# Patient Record
Sex: Female | Born: 1965 | Race: White | Hispanic: No | Smoking: Never smoker
Health system: Southern US, Community
[De-identification: ages and names within clinical notes are randomized; demographics above are authoritative.]

## PROBLEM LIST (undated history)

## (undated) DIAGNOSIS — E119 Type 2 diabetes mellitus without complications: Secondary | ICD-10-CM

## (undated) DIAGNOSIS — E785 Hyperlipidemia, unspecified: Secondary | ICD-10-CM

## (undated) DIAGNOSIS — K219 Gastro-esophageal reflux disease without esophagitis: Secondary | ICD-10-CM

## (undated) DIAGNOSIS — F32A Depression, unspecified: Secondary | ICD-10-CM

## (undated) DIAGNOSIS — G4700 Insomnia, unspecified: Secondary | ICD-10-CM

## (undated) DIAGNOSIS — Z8719 Personal history of other diseases of the digestive system: Secondary | ICD-10-CM

## (undated) DIAGNOSIS — E7800 Pure hypercholesterolemia, unspecified: Secondary | ICD-10-CM

## (undated) DIAGNOSIS — F411 Generalized anxiety disorder: Secondary | ICD-10-CM

## (undated) DIAGNOSIS — F988 Other specified behavioral and emotional disorders with onset usually occurring in childhood and adolescence: Secondary | ICD-10-CM

## (undated) DIAGNOSIS — F329 Major depressive disorder, single episode, unspecified: Secondary | ICD-10-CM

## (undated) DIAGNOSIS — D649 Anemia, unspecified: Secondary | ICD-10-CM

## (undated) DIAGNOSIS — M1990 Unspecified osteoarthritis, unspecified site: Secondary | ICD-10-CM

## (undated) DIAGNOSIS — Z8051 Family history of malignant neoplasm of kidney: Secondary | ICD-10-CM

## (undated) DIAGNOSIS — M889 Osteitis deformans of unspecified bone: Secondary | ICD-10-CM

## (undated) DIAGNOSIS — T7840XA Allergy, unspecified, initial encounter: Secondary | ICD-10-CM

## (undated) DIAGNOSIS — N189 Chronic kidney disease, unspecified: Secondary | ICD-10-CM

## (undated) DIAGNOSIS — I10 Essential (primary) hypertension: Secondary | ICD-10-CM

## (undated) DIAGNOSIS — G2581 Restless legs syndrome: Secondary | ICD-10-CM

## (undated) DIAGNOSIS — C801 Malignant (primary) neoplasm, unspecified: Secondary | ICD-10-CM

## (undated) DIAGNOSIS — C649 Malignant neoplasm of unspecified kidney, except renal pelvis: Secondary | ICD-10-CM

## (undated) DIAGNOSIS — G709 Myoneural disorder, unspecified: Secondary | ICD-10-CM

## (undated) DIAGNOSIS — E079 Disorder of thyroid, unspecified: Secondary | ICD-10-CM

## (undated) DIAGNOSIS — F419 Anxiety disorder, unspecified: Secondary | ICD-10-CM

## (undated) DIAGNOSIS — M810 Age-related osteoporosis without current pathological fracture: Secondary | ICD-10-CM

## (undated) HISTORY — DX: Gastro-esophageal reflux disease without esophagitis: K21.9

## (undated) HISTORY — DX: Type 2 diabetes mellitus without complications: E11.9

## (undated) HISTORY — DX: Chronic kidney disease, unspecified: N18.9

## (undated) HISTORY — DX: Major depressive disorder, single episode, unspecified: F32.9

## (undated) HISTORY — PX: UPPER GASTROINTESTINAL ENDOSCOPY: SHX188

## (undated) HISTORY — DX: Depression, unspecified: F32.A

## (undated) HISTORY — DX: Family history of malignant neoplasm of kidney: Z80.51

## (undated) HISTORY — DX: Allergy, unspecified, initial encounter: T78.40XA

## (undated) HISTORY — DX: Anemia, unspecified: D64.9

## (undated) HISTORY — DX: Personal history of other diseases of the digestive system: Z87.19

## (undated) HISTORY — DX: Anxiety disorder, unspecified: F41.9

## (undated) HISTORY — DX: Essential (primary) hypertension: I10

## (undated) HISTORY — DX: Age-related osteoporosis without current pathological fracture: M81.0

## (undated) HISTORY — DX: Unspecified osteoarthritis, unspecified site: M19.90

## (undated) HISTORY — DX: Myoneural disorder, unspecified: G70.9

## (undated) HISTORY — PX: COLONOSCOPY: SHX174

## (undated) HISTORY — DX: Other specified behavioral and emotional disorders with onset usually occurring in childhood and adolescence: F98.8

## (undated) HISTORY — DX: Generalized anxiety disorder: F41.1

## (undated) HISTORY — DX: Hyperlipidemia, unspecified: E78.5

## (undated) HISTORY — DX: Osteitis deformans of unspecified bone: M88.9

## (undated) HISTORY — DX: Restless legs syndrome: G25.81

## (undated) HISTORY — DX: Malignant neoplasm of unspecified kidney, except renal pelvis: C64.9

## (undated) HISTORY — PX: NASAL SEPTUM SURGERY: SHX37

## (undated) HISTORY — DX: Disorder of thyroid, unspecified: E07.9

## (undated) HISTORY — DX: Insomnia, unspecified: G47.00

## (undated) HISTORY — DX: Pure hypercholesterolemia, unspecified: E78.00

## (undated) HISTORY — DX: Malignant (primary) neoplasm, unspecified: C80.1

---
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
## 1998-01-15 ENCOUNTER — Other Ambulatory Visit: Admission: RE | Admit: 1998-01-15 | Discharge: 1998-01-15 | Payer: Self-pay | Admitting: Family Medicine

## 1999-01-30 ENCOUNTER — Other Ambulatory Visit: Admission: RE | Admit: 1999-01-30 | Discharge: 1999-01-30 | Payer: Self-pay | Admitting: Family Medicine

## 2011-03-14 ENCOUNTER — Encounter: Payer: Self-pay | Admitting: Gastroenterology

## 2012-02-12 ENCOUNTER — Other Ambulatory Visit: Payer: Self-pay | Admitting: Family Medicine

## 2012-07-09 ENCOUNTER — Other Ambulatory Visit: Payer: Self-pay | Admitting: Family Medicine

## 2012-07-09 DIAGNOSIS — Z1231 Encounter for screening mammogram for malignant neoplasm of breast: Secondary | ICD-10-CM

## 2012-07-30 ENCOUNTER — Ambulatory Visit
Admission: RE | Admit: 2012-07-30 | Discharge: 2012-07-30 | Disposition: A | Discharge status: Home / Self Care | Source: Ambulatory Visit | Attending: Family Medicine | Admitting: Family Medicine

## 2012-07-30 DIAGNOSIS — Z1231 Encounter for screening mammogram for malignant neoplasm of breast: Secondary | ICD-10-CM

## 2012-11-30 DIAGNOSIS — G250 Essential tremor: Secondary | ICD-10-CM

## 2012-11-30 HISTORY — DX: Essential tremor: G25.0

## 2013-08-08 ENCOUNTER — Other Ambulatory Visit: Payer: Self-pay

## 2013-08-08 DIAGNOSIS — Z1231 Encounter for screening mammogram for malignant neoplasm of breast: Secondary | ICD-10-CM

## 2013-08-09 ENCOUNTER — Ambulatory Visit
Admission: RE | Admit: 2013-08-09 | Discharge: 2013-08-09 | Disposition: A | Discharge status: Home / Self Care | Source: Ambulatory Visit

## 2013-08-09 DIAGNOSIS — Z1231 Encounter for screening mammogram for malignant neoplasm of breast: Secondary | ICD-10-CM

## 2014-07-19 ENCOUNTER — Other Ambulatory Visit: Payer: Self-pay

## 2014-07-19 DIAGNOSIS — Z1231 Encounter for screening mammogram for malignant neoplasm of breast: Secondary | ICD-10-CM

## 2014-08-11 ENCOUNTER — Ambulatory Visit
Admission: RE | Admit: 2014-08-11 | Discharge: 2014-08-11 | Disposition: A | Discharge status: Home / Self Care | Source: Ambulatory Visit

## 2014-08-11 DIAGNOSIS — Z1231 Encounter for screening mammogram for malignant neoplasm of breast: Secondary | ICD-10-CM

## 2015-08-15 DIAGNOSIS — G44229 Chronic tension-type headache, not intractable: Secondary | ICD-10-CM | POA: Insufficient documentation

## 2015-08-15 HISTORY — DX: Chronic tension-type headache, not intractable: G44.229

## 2015-09-26 HISTORY — PX: ESOPHAGOGASTRODUODENOSCOPY: SHX1529

## 2015-10-08 ENCOUNTER — Other Ambulatory Visit: Payer: Self-pay

## 2015-10-08 DIAGNOSIS — Z1231 Encounter for screening mammogram for malignant neoplasm of breast: Secondary | ICD-10-CM

## 2015-10-26 ENCOUNTER — Ambulatory Visit
Admission: RE | Admit: 2015-10-26 | Discharge: 2015-10-26 | Disposition: A | Discharge status: Home / Self Care | Source: Ambulatory Visit

## 2015-10-26 DIAGNOSIS — Z1231 Encounter for screening mammogram for malignant neoplasm of breast: Secondary | ICD-10-CM

## 2016-01-14 ENCOUNTER — Encounter: Payer: Self-pay | Admitting: Gastroenterology

## 2016-11-18 ENCOUNTER — Other Ambulatory Visit: Payer: Self-pay | Admitting: Family Medicine

## 2016-11-18 DIAGNOSIS — Z1231 Encounter for screening mammogram for malignant neoplasm of breast: Secondary | ICD-10-CM

## 2016-12-04 ENCOUNTER — Ambulatory Visit
Admission: RE | Admit: 2016-12-04 | Discharge: 2016-12-04 | Disposition: A | Discharge status: Home / Self Care | Source: Ambulatory Visit | Attending: Family Medicine | Admitting: Family Medicine

## 2016-12-04 DIAGNOSIS — Z1231 Encounter for screening mammogram for malignant neoplasm of breast: Secondary | ICD-10-CM

## 2017-01-09 ENCOUNTER — Encounter (HOSPITAL_COMMUNITY): Payer: Self-pay

## 2017-01-09 DIAGNOSIS — Z79899 Other long term (current) drug therapy: Secondary | ICD-10-CM | POA: Insufficient documentation

## 2017-01-09 DIAGNOSIS — R6889 Other general symptoms and signs: Secondary | ICD-10-CM | POA: Insufficient documentation

## 2017-01-09 DIAGNOSIS — Z91040 Latex allergy status: Secondary | ICD-10-CM | POA: Insufficient documentation

## 2017-01-09 DIAGNOSIS — Z7982 Long term (current) use of aspirin: Secondary | ICD-10-CM | POA: Diagnosis not present

## 2017-01-09 DIAGNOSIS — Z8781 Personal history of (healed) traumatic fracture: Secondary | ICD-10-CM | POA: Insufficient documentation

## 2017-01-09 DIAGNOSIS — R0789 Other chest pain: Secondary | ICD-10-CM | POA: Diagnosis present

## 2017-01-09 NOTE — ED Triage Notes (Signed)
Pt brought in by EMS with c/o left lower rib pain that began today when pt sister went to assist pt in the bathroom and grab the area while assisting pt, pt has since been having pain throughout the day. Pt denies and SOB.

## 2017-01-10 ENCOUNTER — Emergency Department (HOSPITAL_COMMUNITY)
Admission: EM | Admit: 2017-01-10 | Discharge: 2017-01-10 | Disposition: A | Discharge status: Home / Self Care | Attending: Emergency Medicine | Admitting: Emergency Medicine

## 2017-01-10 ENCOUNTER — Emergency Department (HOSPITAL_COMMUNITY)

## 2017-01-10 DIAGNOSIS — Z789 Other specified health status: Secondary | ICD-10-CM

## 2017-01-10 DIAGNOSIS — Z8781 Personal history of (healed) traumatic fracture: Secondary | ICD-10-CM

## 2017-01-10 MED ORDER — OXYCODONE-ACETAMINOPHEN 5-325 MG PO TABS
1.0000 | ORAL_TABLET | Freq: Once | ORAL | Status: AC
  Administered 2017-01-10: 1 via ORAL
  Filled 2017-01-10: qty 1

## 2017-01-10 NOTE — ED Notes (Signed)
She remains in no distress. Our social worker has spoken with her and is seeking information as to availability of services with her insurance.

## 2017-01-10 NOTE — ED Notes (Signed)
PTAR notified at this time to tx pt. To [place] (she resides with her sister). Pt. Remains in no distress.

## 2017-01-10 NOTE — Progress Notes (Signed)
CSW spoke with patient and patient's daughter at bedside. Patient's daughter reported that patient's family is having issues assisting patient with ADLs. Patient reported that she currently has Home Health PT/OT and that she is being seen by an orthopedic doctor for fractures, patient reported that she is on light bed rest. CSW notified RNCM that patient requires additional assistance in the home, RNCM agreed to meet with patient. CSW provided patient with list of private duty care agencies for additional assistance in the home. CSW signing off, no other needs identified at this time. Please consult if new needs arise.   Abundio Miu, [facility]  Clinical Social Worker [phone]

## 2017-01-10 NOTE — ED Notes (Signed)
In report, the nurse told me that we are awaiting social work consult r/t pt. Inability to complete ADL's; and her current situation (family is caring for her) is insufficient; they "can't take care of her anymore". Pt. Is in no distress.

## 2017-01-10 NOTE — Discharge Instructions (Signed)
Case management and social work have evaluated. We have sent in an order for home health aid evaluation.  Continue taking prescribed medications as indicated

## 2017-01-10 NOTE — ED Notes (Signed)
Per SW, states she is contacting insurance to inquire about services over the weekend

## 2017-01-10 NOTE — ED Notes (Signed)
Our social worker, Ameila has just entered pt's. Room.

## 2017-01-10 NOTE — Care Management Note (Signed)
51 yo F in the ED c/o left lower rib pain. Per SW, pt needs assistance with HH.  Met with pt and daughter. Pt lives with her husband. Daughter is concern about pt's safety and not being able to assist pt with ADL's. Pt reports that her husband assists her. Pt is active with [facility] for PT and OT. Discussed HH aide and a SW and they agreed with these services. Contacted Butch Penny at [place] at [facility] and she accepted the referral. Encouraged daughter to discuss concerns with physician. Daughter Anderson Malta) provided her phone # [phone] in case the [facility] agency is not able to reach her mother. Provided daughter's phone # to Butch Penny.

## 2017-01-10 NOTE — ED Provider Notes (Signed)
Patient was handed off to me by previous EDP Charlann Lange PA-C at shift change pending social work consult. Please see previous note for full history of present illness and physical exam. Briefly, patient is a 51 year old female with left knee fracture and rib fractures from mechanical fall 2 weeks ago. She reports increased difficulty performing ADLs at home, elderly husband unable to care for her. Patient's sister has tried to help but is unfortunately unable to help she works and goes to school daily. Patient reports unsteadiness while walking in her for further injury.   1000: Education officer, museum in room, will wait for recommendations.   1230: SW and CM evaluated patient. They recommend [facility] aide, I have placed the order. Patient and daughter updated about pending order. They are agreeable. Patient will be transported to her sister's home.    Kinnie Feil, PA-C 01/10/17 1228    Rolland Porter, MD 01/16/17 [phone]

## 2017-01-10 NOTE — ED Provider Notes (Signed)
[facility] DEPT Provider Note   CSN: [id number] Arrival date & time: 01/09/17  2231     History   Chief Complaint Chief Complaint  Patient presents with  . Rib Injury    HPI Nancy MAHLER is a 51 y.o. female.  The patient is being treated for left medial epicondylar fracture of the knee as well as right rib fractures, both from falls over the last 2 weeks. She is here for evaluation of new onset of left lateral chest wall pain that started when her sister tried to help her stand. No pain with breathing or SOB. No nausea or vomiting. The patient's sister was attempting to care for her at her home but is concerned she is not equipped to handle her level of care. Both patient and caregiver are requesting assistance with care.    The history is provided by the patient. No language interpreter was used.    History reviewed. No pertinent past medical history.  There are no active problems to display for this patient.   History reviewed. No pertinent surgical history.  OB History    No data available       Home Medications    Prior to Admission medications   Medication Sig Start Date End Date Taking? Authorizing Provider  ARIPiprazole (ABILIFY) 10 MG tablet Take 10 mg by mouth at bedtime.   Yes [provider]  aspirin EC 81 MG tablet Take 81 mg by mouth at bedtime.   Yes [provider]  buPROPion (WELLBUTRIN XL) 300 MG 24 hr tablet Take 300 mg by mouth daily after breakfast.   Yes [provider]  clonazePAM (KLONOPIN) 1 MG tablet Take 1 mg by mouth daily after breakfast.   Yes [provider]  colesevelam (WELCHOL) 625 MG tablet Take 1,875 mg by mouth daily with breakfast.   Yes [provider]  dexlansoprazole (DEXILANT) 60 MG capsule Take 60 mg by mouth at bedtime.   Yes [provider]  Ferrous Sulfate (IRON) 325 (65 Fe) MG TABS Take 1 tablet by mouth at bedtime.   Yes [provider]  gabapentin  (NEURONTIN) 600 MG tablet Take 600 mg by mouth 2 (two) times daily.   Yes [provider]  levothyroxine (SYNTHROID, LEVOTHROID) 137 MCG tablet Take 137 mcg by mouth daily before breakfast.   Yes [provider]  lisdexamfetamine (VYVANSE) 70 MG capsule Take 70 mg by mouth daily after breakfast.   Yes [provider]  meloxicam (MOBIC) 15 MG tablet Take 15 mg by mouth daily as needed for pain.   Yes [provider]  metoprolol succinate (TOPROL-XL) 50 MG 24 hr tablet Take 50 mg by mouth daily after breakfast. Take with or immediately following a meal.   Yes [provider]  montelukast (SINGULAIR) 10 MG tablet Take 10 mg by mouth at bedtime.   Yes [provider]  rizatriptan (MAXALT) 10 MG tablet Take 10 mg by mouth as needed for migraine. May repeat in 2 hours if needed   Yes [provider]  topiramate (TOPAMAX) 50 MG tablet Take 50 mg by mouth 2 (two) times daily.   Yes [provider]  venlafaxine XR (EFFEXOR-XR) 150 MG 24 hr capsule Take 300 mg by mouth daily with breakfast.   Yes [provider]  zolpidem (AMBIEN CR) 12.5 MG CR tablet Take 12.5 mg by mouth at bedtime.   Yes [provider]    Family History No family history on file.  Social History Social History  Substance Use Topics  . Smoking status: Not on file  . Smokeless tobacco: Not on file  . Alcohol use Not on file     Allergies   Latex   Review of Systems Review of Systems  Constitutional: Negative for chills and fever.  HENT: Negative.   Respiratory: Negative.  Negative for shortness of breath.   Cardiovascular: Positive for chest pain (See HPI.).  Gastrointestinal: Negative.  Negative for nausea.  Musculoskeletal: Negative.   Skin: Negative.   Neurological: Positive for weakness.     Physical Exam Updated Vital Signs BP 114/85 (BP Location: Right Arm)   Pulse 79   Temp 99.2 F (37.3 C) (Oral)   Resp 18   SpO2  99%   Physical Exam  Constitutional: She is oriented to person, place, and time. She appears well-developed and well-nourished.  HENT:  Head: Normocephalic.  Neck: Normal range of motion. Neck supple.  Cardiovascular: Normal rate and regular rhythm.   Pulmonary/Chest: Effort normal and breath sounds normal. She has no wheezes. She has no rales. She exhibits tenderness (Left anterolateral chest wall. Right lower chest wall. ).  Abdominal: Soft. Bowel sounds are normal. There is no tenderness. There is no rebound and no guarding.  Musculoskeletal: Normal range of motion.  No deformity of left knee. Pain with any movement. No LE swelling.   Neurological: She is alert and oriented to person, place, and time.  Skin: Skin is warm and dry. No rash noted.  Psychiatric: She has a normal mood and affect.     ED Treatments / Results  Labs (all labs ordered are listed, but only abnormal results are displayed) Labs Reviewed - No data to display  EKG  EKG Interpretation None       Radiology No results found.  Procedures Procedures (including critical care time)  Medications Ordered in ED Medications - No data to display   Initial Impression / Assessment and Plan / ED Course  I have reviewed the triage vital signs and the nursing notes.  Pertinent labs & imaging results that were available during my care of the patient were reviewed by me and considered in my medical decision making (see chart for details).     Patient here with sister concerned about new left rib injury. Images negative for new fracture. They are requesting evaluation for placement for rehab while recovering from left knee non-weight bearing fracture and right rib fracture. She cannot manage her ADL's and sister feels she cannot give her the level of care needed.   Discussed that placement on a weekend would be difficult but offered social work consult for that possibility. Discussed that there is no medical  necessity for admission today. Discussed that if discharged home she would need transportation home as sister cannot manage to get her into her house over 3 steps at entry.   Patient care signed out to Carmon Sails, PA-C, for follow up on social work consult and to determine disposition.   Final Clinical Impressions(s) / ED Diagnoses   Final diagnoses:  None   1. Left chest wall pain 2. Physical debility 3. Recent history left knee fracture  New Prescriptions New Prescriptions   No medications on file     Charlann Lange, Hershal Coria 01/10/17 6734    Rolland Porter, MD 01/10/17 [phone]

## 2017-10-20 ENCOUNTER — Other Ambulatory Visit (HOSPITAL_COMMUNITY): Payer: Self-pay | Admitting: Sports Medicine

## 2017-10-20 DIAGNOSIS — M8700 Idiopathic aseptic necrosis of unspecified bone: Secondary | ICD-10-CM

## 2017-10-20 DIAGNOSIS — M889 Osteitis deformans of unspecified bone: Secondary | ICD-10-CM

## 2017-10-29 ENCOUNTER — Encounter (HOSPITAL_COMMUNITY)
Admission: RE | Admit: 2017-10-29 | Discharge: 2017-10-29 | Disposition: A | Discharge status: Home / Self Care | Source: Ambulatory Visit | Attending: Sports Medicine | Admitting: Sports Medicine

## 2017-10-29 DIAGNOSIS — M8700 Idiopathic aseptic necrosis of unspecified bone: Secondary | ICD-10-CM | POA: Insufficient documentation

## 2017-10-29 DIAGNOSIS — M889 Osteitis deformans of unspecified bone: Secondary | ICD-10-CM | POA: Diagnosis present

## 2017-10-29 MED ORDER — TECHNETIUM TC 99M MEDRONATE IV KIT
20.0000 | PACK | Freq: Once | INTRAVENOUS | Status: AC | PRN
  Administered 2017-10-29: 20 via INTRAVENOUS

## 2017-11-30 DIAGNOSIS — M889 Osteitis deformans of unspecified bone: Secondary | ICD-10-CM | POA: Insufficient documentation

## 2017-11-30 DIAGNOSIS — M810 Age-related osteoporosis without current pathological fracture: Secondary | ICD-10-CM

## 2017-11-30 HISTORY — DX: Age-related osteoporosis without current pathological fracture: M81.0

## 2017-11-30 HISTORY — DX: Osteitis deformans of unspecified bone: M88.9

## 2017-12-18 ENCOUNTER — Other Ambulatory Visit (HOSPITAL_COMMUNITY): Payer: Self-pay | Admitting: *Deleted

## 2017-12-21 ENCOUNTER — Ambulatory Visit (HOSPITAL_COMMUNITY)
Admission: RE | Admit: 2017-12-21 | Discharge: 2017-12-21 | Disposition: A | Discharge status: Home / Self Care | Source: Ambulatory Visit | Attending: Sports Medicine | Admitting: Sports Medicine

## 2017-12-21 DIAGNOSIS — M810 Age-related osteoporosis without current pathological fracture: Secondary | ICD-10-CM | POA: Insufficient documentation

## 2017-12-21 DIAGNOSIS — M889 Osteitis deformans of unspecified bone: Secondary | ICD-10-CM | POA: Diagnosis not present

## 2017-12-21 DIAGNOSIS — S72009A Fracture of unspecified part of neck of unspecified femur, initial encounter for closed fracture: Secondary | ICD-10-CM | POA: Insufficient documentation

## 2017-12-21 MED ORDER — ZOLEDRONIC ACID 5 MG/100ML IV SOLN
INTRAVENOUS | Status: AC
  Filled 2017-12-21: qty 100

## 2017-12-21 MED ORDER — ZOLEDRONIC ACID 5 MG/100ML IV SOLN
5.0000 mg | Freq: Once | INTRAVENOUS | Status: AC
  Administered 2017-12-21: 5 mg via INTRAVENOUS

## 2017-12-21 NOTE — Discharge Instructions (Signed)

## 2018-03-03 ENCOUNTER — Other Ambulatory Visit: Payer: Self-pay | Admitting: Family Medicine

## 2018-03-03 DIAGNOSIS — Z1231 Encounter for screening mammogram for malignant neoplasm of breast: Secondary | ICD-10-CM

## 2018-03-30 ENCOUNTER — Ambulatory Visit
Admission: RE | Admit: 2018-03-30 | Discharge: 2018-03-30 | Disposition: A | Discharge status: Home / Self Care | Source: Ambulatory Visit | Attending: Family Medicine | Admitting: Family Medicine

## 2018-03-30 ENCOUNTER — Ambulatory Visit

## 2018-03-30 DIAGNOSIS — Z1231 Encounter for screening mammogram for malignant neoplasm of breast: Secondary | ICD-10-CM

## 2018-03-31 ENCOUNTER — Other Ambulatory Visit: Payer: Self-pay | Admitting: Family Medicine

## 2018-03-31 DIAGNOSIS — R928 Other abnormal and inconclusive findings on diagnostic imaging of breast: Secondary | ICD-10-CM

## 2018-04-06 ENCOUNTER — Telehealth: Payer: Self-pay | Admitting: Gastroenterology

## 2018-04-06 NOTE — Telephone Encounter (Signed)
Patient states medication dexilant is not working for her anymore and her insurance no longer covers it. Patient is wanting to know if Dr. Lyndel Safe can prescribe her something else. Patient last seen Jan this year 2019.

## 2018-04-07 ENCOUNTER — Ambulatory Visit
Admission: RE | Admit: 2018-04-07 | Discharge: 2018-04-07 | Disposition: A | Discharge status: Home / Self Care | Source: Ambulatory Visit | Attending: Family Medicine | Admitting: Family Medicine

## 2018-04-07 DIAGNOSIS — R928 Other abnormal and inconclusive findings on diagnostic imaging of breast: Secondary | ICD-10-CM

## 2018-04-07 NOTE — Telephone Encounter (Signed)
Please advise 

## 2018-04-08 MED ORDER — PANTOPRAZOLE SODIUM 40 MG PO TBEC: 40.0000 mg | DELAYED_RELEASE_TABLET | Freq: Every day | ORAL | 6 refills | Status: DC

## 2018-04-08 NOTE — Telephone Encounter (Signed)
Can we try Protonix 40 mg p.o. once a day, 30 with 6 refills

## 2018-04-08 NOTE — Telephone Encounter (Signed)
Sent new prescription in to patients pharmacy. Left patient a message to call back if she has any questions.

## 2018-11-25 ENCOUNTER — Telehealth: Payer: Self-pay | Admitting: Gastroenterology

## 2018-11-25 NOTE — Telephone Encounter (Signed)
Patient would like to know if she can get a refill for pantoprazole (PROTONIX) 40 MG

## 2018-11-25 NOTE — Telephone Encounter (Signed)
Would you like to refill this? ?

## 2018-11-26 MED ORDER — PANTOPRAZOLE SODIUM 40 MG PO TBEC: 40.0000 mg | DELAYED_RELEASE_TABLET | Freq: Every day | ORAL | 11 refills | Status: DC

## 2018-11-26 NOTE — Telephone Encounter (Signed)
Please do Protonix 40 mg p.o. once a day, 30 with 11 refills

## 2018-11-26 NOTE — Telephone Encounter (Signed)
Sent refill to patients pharmacy. 

## 2018-12-03 ENCOUNTER — Telehealth: Payer: Self-pay | Admitting: Gastroenterology

## 2018-12-03 NOTE — Telephone Encounter (Signed)
Pt requested prescription for pantoprazole to be sent to [facility]

## 2018-12-06 MED ORDER — PANTOPRAZOLE SODIUM 40 MG PO TBEC: 40.0000 mg | DELAYED_RELEASE_TABLET | Freq: Every day | ORAL | 11 refills | Status: DC

## 2018-12-06 NOTE — Telephone Encounter (Signed)
Sent prescription to patients pharmacy.  

## 2018-12-06 NOTE — Addendum Note (Signed)
Addended by: Karena Addison on: 12/06/2018 08:35 AM   Modules accepted: Orders

## 2019-02-14 ENCOUNTER — Other Ambulatory Visit (HOSPITAL_COMMUNITY): Payer: Self-pay

## 2019-02-15 ENCOUNTER — Encounter (HOSPITAL_COMMUNITY)
Admission: RE | Admit: 2019-02-15 | Discharge: 2019-02-15 | Disposition: A | Discharge status: Home / Self Care | Source: Ambulatory Visit | Attending: Sports Medicine | Admitting: Sports Medicine

## 2019-02-15 ENCOUNTER — Other Ambulatory Visit: Payer: Self-pay

## 2019-02-15 DIAGNOSIS — M810 Age-related osteoporosis without current pathological fracture: Secondary | ICD-10-CM | POA: Insufficient documentation

## 2019-02-15 MED ORDER — ZOLEDRONIC ACID 5 MG/100ML IV SOLN
5.0000 mg | Freq: Once | INTRAVENOUS | Status: AC
  Administered 2019-02-15: 5 mg via INTRAVENOUS

## 2019-02-15 MED ORDER — ZOLEDRONIC ACID 5 MG/100ML IV SOLN
INTRAVENOUS | Status: AC
  Administered 2019-02-15: 5 mg via INTRAVENOUS
  Filled 2019-02-15: qty 100

## 2019-02-16 ENCOUNTER — Encounter: Payer: Self-pay | Admitting: Gastroenterology

## 2019-02-24 ENCOUNTER — Encounter: Payer: Self-pay | Admitting: Gastroenterology

## 2019-03-16 ENCOUNTER — Encounter

## 2019-03-21 ENCOUNTER — Encounter: Payer: Self-pay | Admitting: Gastroenterology

## 2019-03-29 ENCOUNTER — Encounter: Admitting: Gastroenterology

## 2019-03-30 ENCOUNTER — Other Ambulatory Visit: Payer: Self-pay | Admitting: Family Medicine

## 2019-03-30 DIAGNOSIS — Z1231 Encounter for screening mammogram for malignant neoplasm of breast: Secondary | ICD-10-CM

## 2019-04-07 ENCOUNTER — Ambulatory Visit (AMBULATORY_SURGERY_CENTER): Payer: Self-pay | Admitting: *Deleted

## 2019-04-07 ENCOUNTER — Other Ambulatory Visit: Payer: Self-pay

## 2019-04-07 VITALS — Temp 96.8°F | Ht 60.0 in | Wt 168.0 lb

## 2019-04-07 DIAGNOSIS — Z1211 Encounter for screening for malignant neoplasm of colon: Secondary | ICD-10-CM

## 2019-04-07 MED ORDER — SUPREP BOWEL PREP KIT 17.5-3.13-1.6 GM/177ML PO SOLN: 1.0000 | Freq: Once | ORAL | 0 refills | Status: AC

## 2019-04-07 NOTE — Progress Notes (Signed)
No egg or soy allergy known to patient  No issues with past sedation with any surgeries  or procedures, no intubation problems  No diet pills per patient No home 02 use per patient  No blood thinners per patient  Pt states  issues with constipation - she does use Miralax mostly every day - it does help with regulating stools and makes them softer but still has issues  No A fib or A flutter  EMMI video sent to pt's e mail  Per [facility] 2 day Suprep per Dr Lyndel Safe - 2017 colon poor prep   Due to the COVID-19 pandemic we are asking patients to follow these guidelines. Please only bring one care partner. Please be aware that your care partner may wait in the car in the parking lot or if they feel like they will be too hot to wait in the car, they may wait in the lobby on the 4th floor. All care partners are required to wear a mask the entire time (we do not have any that we can provide them), they need to practice social distancing, and we will do a Covid check for all patient's and care partners when you arrive. Also we will check their temperature and your temperature. If the care partner waits in their car they need to stay in the parking lot the entire time and we will call them on their cell phone when the patient is ready for discharge so they can bring the car to the front of the building. Also all patient's will need to wear a mask into building.

## 2019-04-13 ENCOUNTER — Encounter: Payer: Self-pay | Admitting: Gastroenterology

## 2019-04-21 ENCOUNTER — Telehealth: Payer: Self-pay

## 2019-04-21 NOTE — Telephone Encounter (Signed)
Covid-19 screening questions   Do you now or have you had a fever in the last 14 days?  Do you have any respiratory symptoms of shortness of breath or cough now or in the last 14 days?  Do you have any family members or close contacts with diagnosed or suspected Covid-19 in the past 14 days?  Have you been tested for Covid-19 and found to be positive?       

## 2019-04-22 ENCOUNTER — Ambulatory Visit (AMBULATORY_SURGERY_CENTER): Admitting: Gastroenterology

## 2019-04-22 ENCOUNTER — Other Ambulatory Visit: Payer: Self-pay

## 2019-04-22 ENCOUNTER — Encounter: Payer: Self-pay | Admitting: Gastroenterology

## 2019-04-22 VITALS — BP 100/77 | HR 81 | Temp 98.5°F | Resp 22 | Ht 61.0 in | Wt 168.0 lb

## 2019-04-22 DIAGNOSIS — Z1211 Encounter for screening for malignant neoplasm of colon: Secondary | ICD-10-CM

## 2019-04-22 MED ORDER — SODIUM CHLORIDE 0.9 % IV SOLN: 500.0000 mL | Freq: Once | INTRAVENOUS | Status: DC

## 2019-04-22 NOTE — Patient Instructions (Signed)
Read all of the handouts given to you by your recovery room nurse.  Dr. Lyndel Safe would like for you to take miralax 1 capful once per day.  It is over the counter.   YOU HAD AN ENDOSCOPIC PROCEDURE TODAY AT [facility]:   Refer to the procedure report that was given to you for any specific questions about what was found during the examination.  If the procedure report does not answer your questions, please call your gastroenterologist to clarify.  If you requested that your care partner not be given the details of your procedure findings, then the procedure report has been included in a sealed envelope for you to review at your convenience later.  YOU SHOULD EXPECT: Some feelings of bloating in the abdomen. Passage of more gas than usual.  Walking can help get rid of the air that was put into your GI tract during the procedure and reduce the bloating. If you had a lower endoscopy (such as a colonoscopy or flexible sigmoidoscopy) you may notice spotting of blood in your stool or on the toilet paper. If you underwent a bowel prep for your procedure, you may not have a normal bowel movement for a few days.  Please Note:  You might notice some irritation and congestion in your nose or some drainage.  This is from the oxygen used during your procedure.  There is no need for concern and it should clear up in a day or so.  SYMPTOMS TO REPORT IMMEDIATELY:   Following lower endoscopy (colonoscopy or flexible sigmoidoscopy):  Excessive amounts of blood in the stool  Significant tenderness or worsening of abdominal pains  Swelling of the abdomen that is new, acute  Fever of 100F or higher   For urgent or emergent issues, a gastroenterologist can be reached at any hour by calling [phone].   DIET:  We do recommend a small meal at first, but then you may proceed to your regular diet.  Drink plenty of fluids but you should avoid alcoholic beverages for 24 hours.  ACTIVITY:  You should  plan to take it easy for the rest of today and you should NOT DRIVE or use heavy machinery until tomorrow (because of the sedation medicines used during the test).    FOLLOW UP: Our staff will call the number listed on your records 48-72 hours following your procedure to check on you and address any questions or concerns that you may have regarding the information given to you following your procedure. If we do not reach you, we will leave a message.  We will attempt to reach you two times.  During this call, we will ask if you have developed any symptoms of COVID 19. If you develop any symptoms (ie: fever, flu-like symptoms, shortness of breath, cough etc.) before then, please call [phone].  If you test positive for Covid 19 in the 2 weeks post procedure, please call and report this information to [place].     SIGNATURES/CONFIDENTIALITY: You and/or your care partner have signed paperwork which will be entered into your electronic medical record.  These signatures attest to the fact that that the information above on your After Visit Summary has been reviewed and is understood.  Full responsibility of the confidentiality of this discharge information lies with you and/or your care-partner.

## 2019-04-22 NOTE — Progress Notes (Signed)
Pt's states no medical or surgical changes since previsit or office visit.  KA temps, CW vitals and CM IV.

## 2019-04-22 NOTE — Op Note (Signed)
[facility] Patient Name: Nancy Harris Procedure Date: 04/22/2019 1:42 PM MRN: [id number] Endoscopist: Jackquline Denmark , MD Age: 53 Referring MD:  Date of Birth: 1966/03/13 Gender: Female Account #: [id number] Procedure:                Colonoscopy Indications:              Screening for colorectal malignant neoplasm. H/O                            constipation. Medicines:                Monitored Anesthesia Care Procedure:                Pre-Anesthesia Assessment:                           - Prior to the procedure, a History and Physical                            was performed, and patient medications and                            allergies were reviewed. The patient's tolerance of                            previous anesthesia was also reviewed. The risks                            and benefits of the procedure and the sedation                            options and risks were discussed with the patient.                            All questions were answered, and informed consent                            was obtained. Prior Anticoagulants: The patient has                            taken no previous anticoagulant or antiplatelet                            agents. ASA Grade Assessment: II - A patient with                            mild systemic disease. After reviewing the risks                            and benefits, the patient was deemed in                            satisfactory condition to undergo the procedure.  After obtaining informed consent, the colonoscope                            was passed under direct vision. Throughout the                            procedure, the patient's blood pressure, pulse, and                            oxygen saturations were monitored continuously. The                            Colonoscope was introduced through the anus and                            advanced to the cecum. The colonoscopy was                            performed with some difficulty d/t redundancy of                            the colon, assisted by abdominal pressure. The                            patient tolerated the procedure well. The quality                            of the bowel preparation was good. The ileocecal                            valve, appendiceal orifice, and rectum were                            photographed. Scope In: 1:50:02 PM Scope Out: 2:07:27 PM Scope Withdrawal Time: 0 hours 10 minutes 44 seconds  Total Procedure Duration: 0 hours 17 minutes 25 seconds  Findings:                 Non-bleeding internal hemorrhoids were found during                            retroflexion and during perianal exam. The                            hemorrhoids were moderate.                           The terminal ileum appeared normal.                           The exam was otherwise without abnormality on                            direct and retroflexion views. Complications:            No immediate complications. Estimated Blood Loss:  Estimated blood loss: none. Impression:               - Non-bleeding internal hemorrhoids.                           - Otherwise normal colonoscopy. The colon was                            highly redundant. Recommendation:           - Patient has a contact number available for                            emergencies. The signs and symptoms of potential                            delayed complications were discussed with the                            patient. Return to normal activities tomorrow.                            Written discharge instructions were provided to the                            patient.                           - High fiber diet.                           - Continue present medications.                           - Repeat colonoscopy in 10 years for screening                            purposes. Earlier, if with any new problems or if                             there is any change in family history.                           - MiraLAX 17 g p.o. once a day.                           - Return to GI office PRN.                           - D/W Shanon Brow. Jackquline Denmark, MD 04/22/2019 2:14:50 PM This report has been signed electronically.

## 2019-04-22 NOTE — Addendum Note (Signed)
Addended by: Ernestine Conrad D on: 04/22/2019 05:01 PM   Modules accepted: Orders

## 2019-04-22 NOTE — Progress Notes (Signed)
Report given to PACU, vss 

## 2019-04-26 ENCOUNTER — Telehealth: Payer: Self-pay | Admitting: *Deleted

## 2019-04-26 ENCOUNTER — Telehealth: Payer: Self-pay

## 2019-04-26 NOTE — Telephone Encounter (Signed)
  Follow up Call-  Call back number 04/22/2019  Post procedure Call Back phone  # [phone]  Permission to leave phone message Yes  Some recent data might be hidden     Patient questions:  Message left to call us if necessary.

## 2019-05-09 NOTE — Telephone Encounter (Signed)
LVM

## 2019-05-16 ENCOUNTER — Other Ambulatory Visit: Payer: Self-pay

## 2019-05-16 ENCOUNTER — Ambulatory Visit
Admission: RE | Admit: 2019-05-16 | Discharge: 2019-05-16 | Disposition: A | Discharge status: Home / Self Care | Source: Ambulatory Visit | Attending: Family Medicine | Admitting: Family Medicine

## 2019-05-16 DIAGNOSIS — Z1231 Encounter for screening mammogram for malignant neoplasm of breast: Secondary | ICD-10-CM

## 2020-07-04 ENCOUNTER — Other Ambulatory Visit: Payer: Self-pay | Admitting: *Deleted

## 2020-07-04 ENCOUNTER — Telehealth: Payer: Self-pay | Admitting: Neurology

## 2020-07-04 ENCOUNTER — Encounter: Payer: Self-pay | Admitting: Neurology

## 2020-07-04 ENCOUNTER — Ambulatory Visit (INDEPENDENT_AMBULATORY_CARE_PROVIDER_SITE_OTHER): Admitting: Neurology

## 2020-07-04 VITALS — BP 130/75 | HR 85 | Ht 60.0 in | Wt 176.5 lb

## 2020-07-04 DIAGNOSIS — G479 Sleep disorder, unspecified: Secondary | ICD-10-CM | POA: Diagnosis not present

## 2020-07-04 DIAGNOSIS — R413 Other amnesia: Secondary | ICD-10-CM

## 2020-07-04 NOTE — Patient Instructions (Signed)
We will go to 50 mg at night of the Topamax for 2 weeks, then stop the medication.
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
## 2020-07-04 NOTE — Telephone Encounter (Signed)
[facility]ricare order sent to GI. They will reach out to the patient to schedule.  

## 2020-07-05 LAB — RPR: RPR Ser Ql: NONREACTIVE

## 2020-07-05 LAB — TSH: TSH: 0.398 u[IU]/mL — ABNORMAL LOW (ref 0.450–4.500)

## 2020-07-05 LAB — SEDIMENTATION RATE: Sed Rate: 10 mm/hr (ref 0–40)

## 2020-07-05 LAB — VITAMIN B12: Vitamin B-12: 1060 pg/mL (ref 232–1245)

## 2020-07-11 ENCOUNTER — Other Ambulatory Visit: Payer: Self-pay

## 2020-07-11 ENCOUNTER — Ambulatory Visit
Admission: RE | Admit: 2020-07-11 | Discharge: 2020-07-11 | Disposition: A | Discharge status: Home / Self Care | Source: Ambulatory Visit | Attending: Neurology | Admitting: Neurology

## 2020-07-11 DIAGNOSIS — R413 Other amnesia: Secondary | ICD-10-CM

## 2020-07-13 ENCOUNTER — Telehealth: Payer: Self-pay | Admitting: Neurology

## 2020-07-13 NOTE — Telephone Encounter (Signed)
I called the patient.  MRI of the brain shows minimal white matter changes, mild.  The patient has a history of headache, hypertension, diabetes, and dyslipidemia.  Changes are not unexpected.  The patient will be getting a sleep evaluation in the near future.    MRI brain 07/12/20:  IMPRESSION: Slightly abnormal MRI scan of the brain showing nonspecific tiny subcortical white matter hyperintensities with a differential discussed above.

## 2020-07-18 ENCOUNTER — Encounter: Payer: Self-pay | Admitting: *Deleted

## 2020-07-18 ENCOUNTER — Other Ambulatory Visit: Payer: Self-pay | Admitting: Neurology

## 2020-07-18 ENCOUNTER — Telehealth: Payer: Self-pay | Admitting: Neurology

## 2020-07-18 DIAGNOSIS — R413 Other amnesia: Secondary | ICD-10-CM

## 2020-07-18 NOTE — Telephone Encounter (Signed)
I called the patient again concerning the MRI of the brain.  This shows minimal white matter changes, the patient does have blood sugar issues, hypertension, diabetes, and headache.  All of these are risk factors for mild white matter changes.  The degree of white matter changes not likely causing any memory issues for this patient.

## 2020-07-19 ENCOUNTER — Encounter: Payer: Self-pay | Admitting: *Deleted

## 2020-07-19 ENCOUNTER — Other Ambulatory Visit: Payer: Self-pay | Admitting: Family Medicine

## 2020-07-19 DIAGNOSIS — Z1231 Encounter for screening mammogram for malignant neoplasm of breast: Secondary | ICD-10-CM

## 2020-08-02 ENCOUNTER — Ambulatory Visit (INDEPENDENT_AMBULATORY_CARE_PROVIDER_SITE_OTHER): Admitting: Neurology

## 2020-08-02 ENCOUNTER — Encounter: Payer: Self-pay | Admitting: Neurology

## 2020-08-02 VITALS — BP 122/78 | HR 93 | Ht 60.0 in | Wt 178.0 lb

## 2020-08-02 DIAGNOSIS — G4719 Other hypersomnia: Secondary | ICD-10-CM | POA: Diagnosis not present

## 2020-08-02 DIAGNOSIS — Z79899 Other long term (current) drug therapy: Secondary | ICD-10-CM

## 2020-08-02 DIAGNOSIS — F5104 Psychophysiologic insomnia: Secondary | ICD-10-CM

## 2020-08-02 DIAGNOSIS — F32A Depression, unspecified: Secondary | ICD-10-CM | POA: Diagnosis not present

## 2020-08-02 DIAGNOSIS — R5383 Other fatigue: Secondary | ICD-10-CM

## 2020-08-02 NOTE — Patient Instructions (Signed)

## 2020-08-02 NOTE — Progress Notes (Signed)
SLEEP MEDICINE CLINIC    Provider:  Melvyn Novas, MD  Primary Care Physician:  Abner Greenspan, MD No address on file     Referring Provider:  Lesly Dukes, MD , Margie Ege, NP          Chief Complaint according to patient   Patient presents with:    . New Patient (Initial Visit)           HISTORY OF PRESENT ILLNESS:  Nancy Harris is a 55 y.o. Caucasian female patient seen here  officially for fatigue, but she reports her problem is chronic insomnia- she relies on sleep medication for many years, is treated by psychiatry- Ambien CR 12.5 mg. Recently cut to 6.25 mg has not worked.  VISIT on 08/02/2020 : Chief concern according to patient :  Nancy Harris with sister, rm 10. Presents with concerns that have been going on for a while. She struggles with insomnia. States she can avg 5-6 hrs a night. She wakes up earlier than suppose to and is unable to go back to sleep. Feels exhausted during the day. Never had a SS. States she has leg twitches and snores in sleep   I have the pleasure of seeing Nancy Harris on 08-02-2020, a right-handed Caucasian female with a chronic Insomnia sleep disorder.  She  has a past medical history of Allergy, Anemia, Anxiety, Arthritis, Cancer (HCC), Chronic kidney disease, Depression, Diabetes mellitus without complication (HCC), GERD (gastroesophageal reflux disease), constipation, Hyperlipidemia, Hypertension, Neuromuscular disorder (HCC), Osteoporosis, Paget disease of bone, and Thyroid disease.  Bipolar Depression History- with anxiety and panic attacks.    Sleep relevant medical history: Nightmares- Night terrors, sleep walking- all in childhood..  Had deviated septum repair, chronic sinus problems, 2010.  Family medical /sleep history: No other family member on CPAP with OSA, insomnia, sleep walkers.    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 Husband passed in May  2021, he used to work third shift-   Family status is widowed.  The patient currently working day time hours.  Tobacco use: none    ETOH use ; none , Caffeine intake in form of Coffee( 1 cup a week)   Sleep habits are as follows: The patient's dinner time is between 7-10 PM.  The patient goes to bed at 1 AM - falling asleep by 1.30 AM-  on medication and continues to sleep for 5-7 hours, does not wake for bathroom breaks. The preferred sleep position is on her back and rarely on hersides, with the support of 1-2 pillows. Dreams are reportedly frequent/vivid.  8-9 AM is the usual rise time. The patient wakes up with an alarm.  Has difficulties to get up-  She reports not feeling refreshed or restored in AM, with symptoms such as dry mouth, morning headaches, and residual fatigue.  Naps are taken infrequently, in front of the TV- lasting from 30-45 minutes.   Review of Systems: Out of a complete 14 system review, the patient complains of only the following symptoms, and all other reviewed systems are negative.:  Fatigue, sleepiness , snoring, fragmented sleep, Insomnia.    How likely are you to doze in the following situations: 0 = not likely, 1 = slight chance, 2 = moderate chance, 3 = high chance   Sitting and Reading? Watching Television? Sitting inactive in a public place (theater or meeting)? As a passenger in a car  for an hour without a break? Lying down in the afternoon when circumstances permit? Sitting and talking to someone? Sitting quietly after lunch without alcohol? In a car, while stopped for a few minutes in traffic?   Total = 18/ 24 points   FSS endorsed at 61 / 63 points.   Social History   Socioeconomic History  . Marital status: Widowed    Spouse name: Not on file  . Number of children: Not on file  . Years of education: Not on file  . Highest education level: Not on file  Occupational History  . Occupation: Scientist, water quality    Comment: Part time  Tobacco Use  .  Smoking status: Never Smoker  . Smokeless tobacco: Never Used  Substance and Sexual Activity  . Alcohol use: Never  . Drug use: Never  . Sexual activity: Not on file  Other Topics Concern  . Not on file  Social History Narrative   Lives alone   Right Handed   Drinks no caffeine   Social Determinants of Health   Financial Resource Strain: Not on file  Food Insecurity: Not on file  Transportation Needs: Not on file  Physical Activity: Not on file  Stress: Not on file  Social Connections: Not on file    Family History  Problem Relation Age of Onset  . Colon cancer Paternal Grandmother   . Alzheimer's disease Mother   . Breast cancer Neg Hx   . Colon polyps Neg Hx   . Esophageal cancer Neg Hx   . Rectal cancer Neg Hx   . Stomach cancer Neg Hx     Past Medical History:  Diagnosis Date  . Allergy   . Anemia    past hx   . Anxiety   . Arthritis   . Cancer ([place])    skin cancer  . Chronic kidney disease   . Depression   . Diabetes mellitus without complication ([place])   . GERD (gastroesophageal reflux disease)   . Hx of constipation   . Hyperlipidemia   . Hypertension   . Neuromuscular disorder (HCC)    HH, CTS, neuropathy feet and hands   . Osteoporosis   . Paget disease of bone   . Thyroid disease     Past Surgical History:  Procedure Laterality Date  . CESAREAN SECTION  10/1990  . CHOLECYSTECTOMY  10/2000  . COLONOSCOPY    . NASAL SEPTUM SURGERY     07/2008 or 07/2010- Nancy Harris unsure   . TOTAL ABDOMINAL HYSTERECTOMY  10/2004  . UPPER GASTROINTESTINAL ENDOSCOPY       Current Outpatient Medications on File Prior to Visit  Medication Sig Dispense Refill  . amphetamine-dextroamphetamine (ADDERALL XR) 25 MG 24 hr capsule     . aspirin EC 81 MG tablet Take 81 mg by mouth at bedtime.    Marland Kitchen BIOTIN PO Take 5,000 mcg by mouth at bedtime.    . brexpiprazole (REXULTI) 2 MG TABS tablet Take 2 mg by mouth at bedtime.    Marland Kitchen buPROPion (WELLBUTRIN XL) 300 MG 24 hr tablet Take  300 mg by mouth daily after breakfast.    . Calcium Carb-Cholecalciferol (CALCIUM-VITAMIN D3) 600-400 MG-UNIT CAPS Frequency:two times daily with food   Dosage:600-400   MG-UNIT  Instructions:Calcium 600/Vitamin D (600-400MG -UNIT TABS, one Oral two times daily with food)  Note:    . clonazePAM (KLONOPIN) 1 MG tablet Take 1 mg by mouth daily after breakfast.    . colesevelam (WELCHOL) 625 MG tablet Take 1,875  mg by mouth 2 (two) times daily with a meal.    . Cyanocobalamin (VITAMIN B-12 PO) Take 1,000 mcg by mouth every morning.    . Dulaglutide (TRULICITY) 3 0000000 SOPN Inject 3 mg into the skin once a week.    . ezetimibe (ZETIA) 10 MG tablet     . Ferrous Sulfate (IRON) 325 (65 Fe) MG TABS Take 1 tablet by mouth at bedtime.    . gabapentin (NEURONTIN) 600 MG tablet Take 600 mg by mouth 2 (two) times daily.    Marland Kitchen levocetirizine (XYZAL) 5 MG tablet     . levothyroxine (SYNTHROID, LEVOTHROID) 137 MCG tablet Take 137 mcg by mouth daily before breakfast.    . Melatonin 10 MG TABS Take 10 mg by mouth at bedtime.    . metFORMIN (GLUCOPHAGE) 500 MG tablet Take 500 mg by mouth 2 (two) times daily with a meal.    . metoprolol succinate (TOPROL-XL) 50 MG 24 hr tablet Take 50 mg by mouth daily after breakfast. Take with or immediately following a meal.    . Multiple Vitamins-Minerals (ZINC PO) Take 50 mg by mouth daily. After evening meal    . Omega-3 Fatty Acids (OMEGA 3 PO) Take 1,000 mg by mouth daily. After evening meal    . pantoprazole (PROTONIX) 40 MG tablet Take 1 tablet (40 mg total) by mouth daily. 30 tablet 11  . pioglitazone (ACTOS) 45 MG tablet Take 45 mg by mouth every morning.    . venlafaxine XR (EFFEXOR-XR) 150 MG 24 hr capsule Take 300 mg by mouth daily with breakfast.    . Vitamin D, Ergocalciferol, (DRISDOL) 1.25 MG (50000 UT) CAPS capsule Take 50,000 Units by mouth once a week.    . zolpidem (AMBIEN CR) 12.5 MG CR tablet Take 6.25 mg by mouth at bedtime.     No current  facility-administered medications on file prior to visit.    Allergies  Allergen Reactions  . Latex Itching    Physical exam:  Today's Vitals   08/02/20 0904  BP: 122/78  Pulse: 93  Weight: 178 lb (80.7 kg)  Height: 5' (1.524 m)   Body mass index is 34.76 kg/m.   Wt Readings from Last 3 Encounters:  08/02/20 178 lb (80.7 kg)  07/04/20 176 lb 8 oz (80.1 kg)  04/22/19 168 lb (76.2 kg)     Ht Readings from Last 3 Encounters:  08/02/20 5' (1.524 m)  07/04/20 5' (1.524 m)  04/22/19 5\' 1"  (1.549 m)      General: The patient is awake, alert and appears not in acute distress. The patient is well groomed. Head: Normocephalic, atraumatic. Neck is supple. Mallampati 2,  neck circumference: 17inches .  Nasal airflow barely patent.  Retrognathia is not seen.  Short neck.  Dental status: biological teeth.  Cardiovascular:  Regular rate and cardiac rhythm by pulse,  without distended neck veins. Respiratory: Lungs are clear to auscultation.  Skin:  Without evidence of ankle edema, or rash. Trunk: The patient's posture is erect.   Neurologic exam : The patient is awake and alert, oriented to place and time.   Memory subjective described as intact.  Attention span & concentration ability appears normal.  Speech is fluent,  without  dysarthria, dysphonia or aphasia.  Mood and affect are meek,   Cranial nerves: no loss of smell or taste reported  Pupils are equal and briskly reactive to light. Funduscopic exam deferred.  Extraocular movements in vertical and horizontal planes were intact and  without nystagmus. No Diplopia. Visual fields by finger perimetry are intact. Hearing was intact to soft voice and finger rubbing.    Facial sensation intact to fine touch.  Facial motor strength is symmetric and tongue and uvula move midline.  Neck ROM : rotation, tilt and flexion extension were normal for age and shoulder shrug was symmetrical.    Motor exam:  Symmetric bulk, tone and ROM.    Normal tone without cog -wheeling, symmetric grip strength .   Sensory:  Fine touch, pinprick and vibration were tested  and  normal.  Proprioception tested in the upper extremities was normal.   Coordination: Rapid alternating movements in the fingers/hands were of normal speed.  The Finger-to-nose maneuver was intact without evidence of ataxia, dysmetria or tremor.   Gait and station: Patient could rise unassisted from a seated position, walked without assistive device. Left foot inverted. pagets disease with osteolytic lesions, osteoporosis.  Stance is of normal width/ base. Toe and heel walk were deferred.  Deep tendon reflexes: in the  upper and lower extremities are symmetric and intact.  Babinski response was deferred .      After spending a total time of  45 minutes face to face and additional time for physical and neurologic examination, review of laboratory studies,  personal review of imaging studies, reports and results of other testing and review of referral information / records as far as provided in visit, I have established the following assessments:  1) a progress note from 04 July 2020 by Dr. Lenor Coffin states that the patient is feeling spaced out of been cannot focus may be daydreaming throughout the day, she feels excessively fatigued and she can fall asleep easily but not physically active or mentally stimulated.  At night however she has great difficulties initiating sleep and she has 2 years used Ambien.  Most recently her Ambien dose was helped and she needed to add melatonin in order to sleep better.  She manages her own medications.  She is working but she has some difficulties getting up in the morning and her sister is often helping her to stay on time.  Medications are in detail listed in Dr. Tobey Grim note.  He has evaluated for ongoing cognitive issues and a family history of Alzheimer's affecting her mother.  The patient is widowed.  This was rather  recently in May 2021.  I reviewed her medication list which includes stimulants such as Vyvanse 70 mg capsule she used to be on Adderall Abilify Wellbutrin 300 mg she states that she is still taking Adderall this appointment.  She is on Neurontin Dexilant Zetia clonazepam WelChol meloxicam Metformin metoprolol pantoprazole rizatriptan she started on Trulicity in August 8119.  Is also Effexor noted.    So, in  Conclusion, I think her memory disturbance may not be primarily due to bipolar disorder and depression but could be due to the polypharmacy her medications certainly will affect her circadian rhythm as well as her cognitive abilities.  Her chronic headaches may also be exacerbated and felt to be exacerbated due to depression.  Her recent Mini-Mental Status Examination was 29 out of 30.  A MoCA was not attempted.  Given that she endorsed a very high level of fatigue and a very high level of daytime sleepiness in concordance with chronic insomnia I will order a sleep study to screen her for sleep apnea.  We can do the sleep study at home or in the lab.  She will be referred  to neuropsychological testing by Dr. Jannifer Franklin.  He ordered also an MRI of the brain.    My Plan is to proceed with:  1)chronic insomnia is nearly always of psychological origin 2) HST or PSG to screen for apnea, patient is a loud snorer.    In short, Nancy Harris is presenting with chronic insomnia , a symptom that can be attributed to Bipolar disorder, anxiety and grief. RV with  NP within 3 month.   CC: I will share my notes with PCP>   Electronically signed by: Larey Seat, MD 08/02/2020 9:26 AM  [facility] Board certified by The [employer] of Sleep Medicine and Diplomate of the [employer] of Sleep Medicine. Board certified In Neurology through the [place], Fellow of the [employer] of Neurology. Medical Director of [facility].

## 2020-08-22 ENCOUNTER — Other Ambulatory Visit: Payer: Self-pay

## 2020-08-22 ENCOUNTER — Ambulatory Visit (INDEPENDENT_AMBULATORY_CARE_PROVIDER_SITE_OTHER): Admitting: Neurology

## 2020-08-22 DIAGNOSIS — G4719 Other hypersomnia: Secondary | ICD-10-CM

## 2020-08-22 DIAGNOSIS — Z6841 Body Mass Index (BMI) 40.0 and over, adult: Secondary | ICD-10-CM

## 2020-08-22 DIAGNOSIS — F5104 Psychophysiologic insomnia: Secondary | ICD-10-CM

## 2020-08-22 DIAGNOSIS — G4733 Obstructive sleep apnea (adult) (pediatric): Secondary | ICD-10-CM

## 2020-08-22 DIAGNOSIS — F32A Depression, unspecified: Secondary | ICD-10-CM

## 2020-08-22 DIAGNOSIS — G4710 Hypersomnia, unspecified: Secondary | ICD-10-CM

## 2020-08-22 DIAGNOSIS — R5383 Other fatigue: Secondary | ICD-10-CM

## 2020-08-22 DIAGNOSIS — Z79899 Other long term (current) drug therapy: Secondary | ICD-10-CM

## 2020-08-31 ENCOUNTER — Inpatient Hospital Stay: Admission: RE | Admit: 2020-08-31 | Source: Ambulatory Visit

## 2020-09-03 ENCOUNTER — Telehealth: Payer: Self-pay | Admitting: Neurology

## 2020-09-03 ENCOUNTER — Other Ambulatory Visit: Payer: Self-pay | Admitting: Neurology

## 2020-09-03 DIAGNOSIS — Z6841 Body Mass Index (BMI) 40.0 and over, adult: Secondary | ICD-10-CM | POA: Insufficient documentation

## 2020-09-03 DIAGNOSIS — F32A Depression, unspecified: Secondary | ICD-10-CM | POA: Insufficient documentation

## 2020-09-03 DIAGNOSIS — G4719 Other hypersomnia: Secondary | ICD-10-CM | POA: Insufficient documentation

## 2020-09-03 DIAGNOSIS — Z79899 Other long term (current) drug therapy: Secondary | ICD-10-CM | POA: Insufficient documentation

## 2020-09-03 DIAGNOSIS — F329 Major depressive disorder, single episode, unspecified: Secondary | ICD-10-CM | POA: Insufficient documentation

## 2020-09-03 DIAGNOSIS — F5104 Psychophysiologic insomnia: Secondary | ICD-10-CM | POA: Insufficient documentation

## 2020-09-03 HISTORY — DX: Psychophysiologic insomnia: F51.04

## 2020-09-03 HISTORY — DX: Other hypersomnia: G47.19

## 2020-09-03 NOTE — Telephone Encounter (Signed)
PATIENT'S NAME:  Nancy Harris, Nancy Harris  DOB:      October 16, 1965      MR#:    [id number] Hassell Done     DATE OF RECORDING: 08/22/2020 REFERRING M.D.:  Nancy Floyde Parkins, MD / Nancy Collie, MD  Study Performed:   Baseline Polysomnogram HISTORY:  08-02-2020, Nancy Harris is a 55 y.o. Caucasian widowed female patient and seen here officially for fatigue, but she follows Nancy Nancy Harris for memory loss, and reports her problem is chronic insomnia- she relies on sleep medication for many years, is treated by psychiatry- Ambien CR 12.5 mg. Recently she cut down to 6.25 mg -this has not worked. She struggles with chronic insomnia. States she can avg 5-6 hours a night. She wakes up earlier than supposed to and is unable to go back to sleep and then feels exhausted during the day. States she has leg twitches and snores in her sleep. she has trouble rising in the morning. In her youth she was a sleepwalker, nightmares and night terrors were present.  MHx: Not copied by Tech.: Super-obesity, Anxiety, Bipolar Depression, grief reaction, GERD, DM2 and Paget's disease, HTN and CKD.  The patient endorsed the Epworth Sleepiness Scale at 18/24 points.  FSS at 60 points/ 63.  The patient's weight 178 pounds with a height of 51 (inches), resulting in a BMI of 47.9 kg/m2. The patient's neck circumference measured 17 inches. Patient took Ambien. She declined SPLIT treatment if qualified.    CURRENT MEDICATIONS: Adderall, Aspirin, Abilify was d/c, Biotin, Rexulti, Wellbutrin, Calcium, Klonopin, Welchol, Trulicity, Zetia, iron, Neurontin, Xyzal, Synthroid, melatonin, Glucophage, Toprol, Zinc, Omega 3, Protonix, actose, Effexor, Vitamin D, Ambien   PROCEDURE:  This is a multichannel digital polysomnogram utilizing the Somnostar 11.2 system.  Electrodes and sensors were applied and monitored per AASM Specifications.   EEG, EOG, Chin and Limb EMG, were sampled at 200 Hz.  ECG, Snore and Nasal Pressure, Thermal Airflow, Respiratory  Effort, CPAP Flow and Pressure, Oximetry was sampled at 50 Hz. Digital video and audio were recorded.      BASELINE STUDY: Lights Out was at 22:04 and Lights On at 05:23.  Total recording time (TRT) was 439.5 minutes, with a total sleep time (TST) of 297 minutes.   The patient's sleep latency was 160 minutes.  REM latency was 373.5 minutes.  The sleep efficiency was reduced at only 67.9 %.     SLEEP ARCHITECTURE: WASO (Wake after sleep onset) was 80.5 minutes.  There were 24.5 minutes in Stage N1, 270.5 minutes Stage N2, 1 minutes Stage N3 and 1 minutes in Stage REM.  The percentage of Stage N1 was 8.2%, Stage N2 was 91.1%, Stage N3 was .3% and Stage R (REM sleep) was 00%.   RESPIRATORY ANALYSIS:  There were a total of 118 respiratory events:  0 obstructive apneas, 0 central apneas and 1 mixed apnea with a total of 1 apnea and an apnea index (AI) of 0.2 /hour. There were 117 hypopneas with a hypopnea index of 23.6 /hour. The patient also had respiratory event related arousals (RERAs).    The total APNEA/HYPOPNEA INDEX (AHI) was 23.8/hour.  0 events occurred in REM sleep and 235 events in NREM. There was no REM sleep- all non-REM AHI of 23.9. The patient spent 297 minutes of total sleep time in the supine position and 0 minutes in non-supine. The supine AHI was 23.8/h versus a non-supine AHI of 0.0.  OXYGEN SATURATION & C02:  The Wake baseline 02 saturation was  94%, with the lowest being 84%. Time spent below 89% saturation equaled 4 minutes. The arousals were noted as: 28 were spontaneous, 1 associated with PLMs, 6 were associated with respiratory events. The patient had a total of 59 Periodic Limb Movements.  The Periodic Limb Movement (PLM) Arousal index was only 0.2/hour. Audio and video analysis did not show any abnormal or unusual movements, behaviors, phonations or vocalizations.  Loud Snoring was noted. EKG was in keeping with normal sinus rhythm (NSR).   IMPRESSION:  1. This patient has  moderate severe Obstructive Sleep Apnea (OSA) at an AHI of 23.8/h and her RDI would be higher than that, based on loud snoring. 2. Frequent PLMs with few arousals constituting mild Periodic Limb Movement Disorder (PLMD)to be present. These PLMs all clustered between 1 and 1.30 AM.  3. Loud Primary Snoring, UARS. 4. Poor sleep efficiency due to very long sleep latency. Many spontaneous arousals. Chronic insomnia most likely of psychological origin.      RECOMMENDATIONS:  1. Advise PAP titration to optimize therapy- this is the therapy we can easier follow and is insurance covered.  The patient declined a CPAP offer in advance when asked about a possible implementation of the SPLIT protocol. I leave it up to her to either use auto CPAP or inform us of her preferences, such as possible referral for dental device or Inspire.  2. Insomnia to be followed by cognitive behaviour therapy.   I certify that I have reviewed the entire raw data recording prior to the issuance of this report in accordance with the Standards of Accreditation of the American Academy of Sleep Medicine (AASM)    Nancy Seat, MD Diplomat, American Board of Psychiatry and Neurology  Diplomat, American Board of Sleep Medicine Market researcher, [facility] [employer]

## 2020-09-04 ENCOUNTER — Other Ambulatory Visit: Payer: Self-pay | Admitting: Neurology

## 2020-09-04 DIAGNOSIS — R413 Other amnesia: Secondary | ICD-10-CM

## 2020-09-04 DIAGNOSIS — G479 Sleep disorder, unspecified: Secondary | ICD-10-CM

## 2020-09-04 DIAGNOSIS — G4719 Other hypersomnia: Secondary | ICD-10-CM

## 2020-09-04 DIAGNOSIS — F5104 Psychophysiologic insomnia: Secondary | ICD-10-CM

## 2020-09-04 NOTE — Telephone Encounter (Signed)
I called pt. I advised pt that Dr. Brett Fairy reviewed their sleep study results and found that has moderate sleep apnea and recommends that pt be treated with a cpap. Dr. Brett Fairy recommends that pt return for a repeat sleep study in order to properly titrate the cpap and ensure a good mask fit. Pt is agreeable to returning for a titration study. I advised pt that our sleep lab will file with pt's insurance and call pt to schedule the sleep study when we hear back from the pt's insurance regarding coverage of this sleep study. Pt verbalized understanding of results. Pt had no questions at this time but was encouraged to call back if questions arise.  Marland Kitchen

## 2020-09-05 ENCOUNTER — Ambulatory Visit: Attending: Neurology

## 2020-09-05 ENCOUNTER — Ambulatory Visit

## 2020-09-05 ENCOUNTER — Other Ambulatory Visit: Payer: Self-pay

## 2020-09-05 DIAGNOSIS — R41841 Cognitive communication deficit: Secondary | ICD-10-CM | POA: Diagnosis not present
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
## 2020-09-05 NOTE — Addendum Note (Signed)
Addended by: Larey Seat on: 09/05/2020 01:08 PM   Modules accepted: Orders
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
## 2020-09-05 NOTE — Patient Instructions (Signed)
Memory Compensation Strategies  1. Use "WARM" strategy. W= write it down A=  associate it R=  repeat it M=  make a mental picture  2. You can keep a Memory Notebook. Use a 3-ring notebook with sections for the following:  calendar, important names and phone numbers, medications, doctors' names/phone numbers, "to do list"/reminders, and a section to journal what you did each day  3. Use a calendar to write appointments down.  4. Write yourself a schedule for the day.  This can be placed on the calendar or in a separate section of the Memory Notebook.  Keeping a regular schedule can help memory.  5. Use medication organizer with sections for each day or morning/evening pills  You may need help loading it  6. Keep a basket, or pegboard by the door.   Place items that you need to take out with you in the basket or on the pegboard.  You may also want to include a message board for reminders.  7. Use sticky notes. Place sticky notes with reminders in a place where the task is performed.  For example:  "turn off the stove" placed by the stove, "lock the door" placed on the door at eye level, "take your medications" on the bathroom mirror or by the place where you normally take your medications  8. Use alarms/timers.  Use while cooking to remind yourself to check on food or as a reminder to take your medicine, or as a reminder to make a call, or as a reminder to perform another task, etc.  9. Use a voice recorder app or small tape recorder to record important information and notes for yourself. Go back at the end of the day and listen to these.  

## 2020-09-10 ENCOUNTER — Other Ambulatory Visit: Payer: Self-pay

## 2020-09-10 ENCOUNTER — Ambulatory Visit

## 2020-09-10 DIAGNOSIS — R41841 Cognitive communication deficit: Secondary | ICD-10-CM

## 2020-09-10 NOTE — Therapy (Signed)
[facility] [address] [place], [place], [address] Phone: [phone]   Fax:  [phone]  Speech Language Pathology Treatment  Patient Details  Name: [facility] MRN: [id number] Date of Birth: 1966/05/21 Referring Provider (SLP): Margette Fast   Encounter Date: 09/10/2020   End of Session - 09/10/20 1356    Visit Number 2    Number of Visits 17    Date for SLP Re-Evaluation 12/04/20    SLP Start Time 1315    SLP Stop Time  1400    SLP Time Calculation (min) 45 min    Activity Tolerance Patient tolerated treatment well           Past Medical History:  Diagnosis Date  . Allergy   . Anemia    past hx   . Anxiety   . Arthritis   . Cancer ([place])    skin cancer  . Chronic kidney disease   . Depression   . Diabetes mellitus without complication ([facility])   . GERD (gastroesophageal reflux disease)   . Hx of constipation   . Hyperlipidemia   . Hypertension   . Neuromuscular disorder (HCC)    HH, CTS, neuropathy feet and hands   . Osteoporosis   . Paget disease of bone   . Thyroid disease     Past Surgical History:  Procedure Laterality Date  . CESAREAN SECTION  10/1990  . CHOLECYSTECTOMY  10/2000  . COLONOSCOPY    . NASAL SEPTUM SURGERY     07/2008 or 07/2010- pt unsure   . TOTAL ABDOMINAL HYSTERECTOMY  10/2004  . UPPER GASTROINTESTINAL ENDOSCOPY      There were no vitals filed for this visit.   Subjective Assessment - 09/10/20 1413    Subjective "about the same"    Currently in Pain? Yes    Pain Score 2     Pain Location Head                 ADULT SLP TREATMENT - 09/10/20 1319      General Information   Behavior/Cognition Pleasant mood;Alert;Cooperative      Treatment Provided   Treatment provided Cognitive-Linquistic      Cognitive-Linquistic Treatment   Treatment focused on Cognition;Patient/family/caregiver education    Skilled Treatment Pt arrived 10 mins early,  which is a success given reported difficulty with time management. SLP reviewed compensatory memory strategies from last session, in which pt reported she wrote down all appointments on calendar and initated morning med alarm. SLP assisted pt set up nightly medication alarm and write out daily and weekly schedule as pt reports she has forgetten tasks throughout day. Pt able to write out weekly schedule with good recall of upcoming events with min A.      Assessment / Recommendations / Plan   Plan Continue with current plan of care      Progression Toward Goals   Progression toward goals Progressing toward goals            SLP Education - 09/10/20 1353    Education Details daily schedule, alarms    Person(s) Educated Patient    Methods Explanation;Demonstration;Handout    Comprehension Verbalized understanding;Returned demonstration;Need further instruction            SLP Short Term Goals - 09/10/20 1350      SLP SHORT TERM GOAL #1   Title Pt will use memory compensations for appointments, medicine management, and other daily activities with min A between  or in 3 sessions.    Time 4    Period Weeks    Status On-going      SLP SHORT TERM GOAL #2   Title Pt will use external memory aids to arrive to work/therapy on time for 5 opportunities    Time 4    Period Weeks    Status On-going      SLP SHORT TERM GOAL #3   Title Pt will use energy conversation strategies to reduce fatigue to complete daily tasks related to work and home with min A over 3 sessions    Time 4    Status On-going            SLP Long Term Goals - 09/10/20 1350      SLP LONG TERM GOAL #1   Title Pt will use attention/memory compensations for appointments, medicine management, and other daily activities with rare min A for 3 sessions.    Time 8    Period Weeks    Status On-going      SLP LONG TERM GOAL #2   Title Pt will report no episodes of arriving late to work/therapy/appointments with use of  external memory aids with rare min A from family    Time 8    Period Weeks    Status On-going      SLP LONG TERM GOAL #3   Title Pt will use energy conversation strategies to reduce fatigue to complete daily tasks related to work and home with rare min A over 3 sessions    Time 8    Period Weeks    Status On-going            Plan - 09/10/20 1357    Clinical Impression Statement [facility] presents with 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 Pt arrived to ST session early. SLP reviewed memory compensations, in which pt reported she used calendar for appointments and alarm for cooking and morning tasks (happy light and meds). SLP provided addtional training of memory compensations for daily schdule and other alarms to improve timeliness and decreased episodes of forgetfulness. Overall, good recall exhibited of upcoming events. SLP recommends further education and training of compensatory memory aids and attention strategies to reduce number of missed appointments, decrease tardiness to work, and increase completion of househould tasks for overall improved QOL.    Speech Therapy Frequency 2x / week    Duration 8 weeks    Treatment/Interventions Compensatory strategies;Patient/family education;Functional tasks;Cueing hierarchy;Cognitive reorganization;Compensatory techniques;Internal/external aids;SLP instruction and feedback    Potential to Achieve Goals Fair    Potential Considerations Previous level of function    SLP Home Exercise Plan provided    Consulted and Agree with Plan of Care Patient           Patient will benefit from skilled therapeutic intervention in order to improve the following deficits and impairments:   Cognitive communication deficit    Problem List Patient Active Problem List   Diagnosis Date Noted  . Excessive daytime sleepiness 09/03/2020  . Fatigue due to depression 09/03/2020  . Polypharmacy 09/03/2020  . Chronic insomnia  09/03/2020  . Class 3 severe obesity due to excess calories with serious comorbidity and body mass index (BMI) of 45.0 to 49.9 in adult [facility]) 09/03/2020    Alinda Deem, MA CCC-SLP 09/10/2020, 2:15 PM  [facility] [address] [place], [place], [address] Phone: [phone]   Fax:  [phone]   Name: Nancy  SEAN [facility] MRN: [id number] Date of Birth: 1965-08-16

## 2020-09-12 ENCOUNTER — Other Ambulatory Visit: Payer: Self-pay

## 2020-09-12 ENCOUNTER — Ambulatory Visit

## 2020-09-12 DIAGNOSIS — R41841 Cognitive communication deficit: Secondary | ICD-10-CM | POA: Diagnosis not present

## 2020-09-12 NOTE — Therapy (Signed)
[facility] [address] [place], [place], [address] Phone: [phone]   Fax:  [phone]  Speech Language Pathology Treatment  Patient Details  Name: Nancy Harris MRN: [id number] Date of Birth: 24-Oct-1965 Referring Provider (SLP): Margette Fast   Encounter Date: 09/12/2020   End of Session - 09/12/20 1314    Visit Number 3    Number of Visits 17    Date for SLP Re-Evaluation 12/04/20    SLP Start Time 1    SLP Stop Time  1315    SLP Time Calculation (min) 45 min    Activity Tolerance Patient tolerated treatment well           Past Medical History:  Diagnosis Date  . Allergy   . Anemia    past hx   . Anxiety   . Arthritis   . Cancer ([place])    skin cancer  . Chronic kidney disease   . Depression   . Diabetes mellitus without complication ([place])   . GERD (gastroesophageal reflux disease)   . Hx of constipation   . Hyperlipidemia   . Hypertension   . Neuromuscular disorder (HCC)    HH, CTS, neuropathy feet and hands   . Osteoporosis   . Paget disease of bone   . Thyroid disease     Past Surgical History:  Procedure Laterality Date  . CESAREAN SECTION  10/1990  . CHOLECYSTECTOMY  10/2000  . COLONOSCOPY    . NASAL SEPTUM SURGERY     07/2008 or 07/2010- pt unsure   . TOTAL ABDOMINAL HYSTERECTOMY  10/2004  . UPPER GASTROINTESTINAL ENDOSCOPY      There were no vitals filed for this visit.   Subjective Assessment - 09/12/20 1240    Subjective "I got here on time. I put my speed control on so I didn't speed this time"    Currently in Pain? No/denies                 ADULT SLP TREATMENT - 09/12/20 1237      General Information   Behavior/Cognition Pleasant mood;Alert;Cooperative      Treatment Provided   Treatment provided Cognitive-Linquistic      Cognitive-Linquistic Treatment   Treatment focused on Cognition;Patient/family/caregiver education    Skilled Treatment Pt  arrived on time to ST session, with no speeding reported to make appointment time. Pt did forgot therapy notebook. SLP reviewed how leaving with enough time is vital to her safety and timeliness. Pt reports improvements in recall utilizing trained external aids from last session (alarms and daily schedule). Pt endorsed she was 10 minutes late to work yesterday, in which SLP prompted a time analysis to determine areas to improve on. SLP cued writing out lists to organize and prepare herself for next day. Pt able to write out detailed timeline and schedule with min A. Further instruction needed to improve carryover of strategies.      Assessment / Recommendations / Plan   Plan Continue with current plan of care      Progression Toward Goals   Progression toward goals Progressing toward goals            SLP Education - 09/12/20 1314    Education Details external aids    Person(s) Educated Patient    Methods Explanation;Demonstration;Handout    Comprehension Verbalized understanding;Returned demonstration;Need further instruction            SLP Short Term Goals - 09/12/20 1314  SLP SHORT TERM GOAL #1   Title Pt will use memory compensations for appointments, medicine management, and other daily activities with min A between or in 3 sessions.    Baseline 09-12-20    Time 4    Period Weeks    Status On-going      SLP SHORT TERM GOAL #2   Title Pt will use external memory aids to arrive to work/therapy on time for 5 opportunities    Baseline 09-12-20    Time 4    Period Weeks    Status On-going      SLP SHORT TERM GOAL #3   Title Pt will use energy conversation strategies to reduce fatigue to complete daily tasks related to work and home with min A over 3 sessions    Time 4    Status On-going            SLP Long Term Goals - 09/12/20 1314      SLP LONG TERM GOAL #1   Title Pt will use attention/memory compensations for appointments, medicine management, and other daily  activities with rare min A for 3 sessions.    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 Pt arrived to ST session on time; however, pt reported she was late to work yesterday by 10 mins. Pt reports improvements of recall and timeliness given trained external aids last session. SLP provided addtional training of memory compensations for daily schdule and other alarms to promote increased carryover. SLP recommends further education and training of compensatory memory aids and attention strategies to reduce number of missed appointments, decrease tardiness to work, and increase completion of househould tasks for overall improved QOL.    Speech Therapy Frequency 2x / week    Duration 8 weeks   or 17 total visits   Treatment/Interventions Compensatory strategies;Patient/family education;Functional tasks;Cueing hierarchy;Cognitive reorganization;Compensatory techniques;Internal/external aids;SLP instruction and feedback    Potential to Achieve Goals Fair    Potential Considerations Previous level of function    SLP Home Exercise Plan provided    Consulted and Agree with Plan of Care Patient           Patient will benefit from skilled therapeutic intervention in order to improve the following deficits and impairments:   Cognitive communication deficit    Problem List Patient Active Problem List   Diagnosis  Date Noted  . Excessive daytime sleepiness 09/03/2020  . Fatigue due to depression 09/03/2020  . Polypharmacy 09/03/2020  . Chronic insomnia 09/03/2020  . Class 3 severe obesity due to excess calories with serious comorbidity and body mass index (BMI) of 45.0 to 49.9 in adult [facility]) 09/03/2020    Alinda Deem,  MA CCC-SLP 09/12/2020, 2:36 PM  [facility] [address] [place], [place], [address] Phone: [phone]   Fax:  [phone]   Name: Nancy Harris MRN: [id number] Date of Birth: 03/15/1966

## 2020-09-13 ENCOUNTER — Telehealth: Payer: Self-pay

## 2020-09-13 ENCOUNTER — Encounter

## 2020-09-13 NOTE — Telephone Encounter (Signed)
lvm to reschedule appt for tonight due to tech out sick

## 2020-09-17 ENCOUNTER — Other Ambulatory Visit: Payer: Self-pay

## 2020-09-17 ENCOUNTER — Ambulatory Visit

## 2020-09-17 DIAGNOSIS — R41841 Cognitive communication deficit: Secondary | ICD-10-CM | POA: Diagnosis not present

## 2020-09-17 NOTE — Therapy (Signed)
[facility] [address] [place], [place], [address] Phone: [phone]   Fax:  [phone]  Speech Language Pathology Treatment  Patient Details  Name: Nancy Harris MRN: [id number] Date of Birth: 09/08/1965 Referring Provider (SLP): Margette Fast   Encounter Date: 09/17/2020   End of Session - 09/17/20 1254    Visit Number 4    Number of Visits 17    Date for SLP Re-Evaluation 12/04/20    SLP Start Time 6073   arrived 5 mins late   SLP Stop Time  72    SLP Time Calculation (min) 40 min    Activity Tolerance Patient tolerated treatment well           Past Medical History:  Diagnosis Date  . Allergy   . Anemia    past hx   . Anxiety   . Arthritis   . Cancer ([place])    skin cancer  . Chronic kidney disease   . Depression   . Diabetes mellitus without complication ([place])   . GERD (gastroesophageal reflux disease)   . Hx of constipation   . Hyperlipidemia   . Hypertension   . Neuromuscular disorder (HCC)    HH, CTS, neuropathy feet and hands   . Osteoporosis   . Paget disease of bone   . Thyroid disease     Past Surgical History:  Procedure Laterality Date  . CESAREAN SECTION  10/1990  . CHOLECYSTECTOMY  10/2000  . COLONOSCOPY    . NASAL SEPTUM SURGERY     07/2008 or 07/2010- pt unsure   . TOTAL ABDOMINAL HYSTERECTOMY  10/2004  . UPPER GASTROINTESTINAL ENDOSCOPY      There were no vitals filed for this visit.          ADULT SLP TREATMENT - 09/17/20 1237      General Information   Behavior/Cognition Pleasant mood;Alert;Cooperative      Treatment Provided   Treatment provided Cognitive-Linquistic      Cognitive-Linquistic Treatment   Treatment focused on Cognition;Patient/family/caregiver education    Skilled Treatment Pt arrived 5 mins late for ST session, as she reportedly left her home 15 minutes late. Pt reported she forgot cellphone and therapy folder at home. SLP  completed task analysis of morning routine to pinpoint time mismanagement. Pt reported she woke up 30 mins late, prioritized cleaning for 2 hours, and spoke to lawyer's office for ~1 hour. SLP emphasized importance of routine, consistency, and prioritization, with pt able to ID tasks she should have prioritzed versus scheduled for later time with min A. Pt reports she has continued to struggle with taking medications on time and going to bed at a decent hour, in which SLP encouraged use of alarm and specified routine to improve sleep habits. 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      Assessment / Recommendations / Plan   Plan Continue with current plan of care      Progression Toward Goals   Progression toward goals Progressing toward goals            SLP Education - 09/17/20 1324    Education Details external aids, time management analysis    Person(s) Educated Patient    Methods Explanation;Demonstration;Handout    Comprehension Verbalized understanding;Returned demonstration;Need further instruction            SLP Short Term Goals - 09/17/20 1238  SLP SHORT TERM GOAL #1   Title Pt will use memory compensations for appointments, medicine management, and other daily activities with min A between or in 3 sessions.    Baseline 09-12-20    Time 3    Period Weeks    Status On-going      SLP SHORT TERM GOAL #2   Title Pt will use external memory aids to arrive to work/therapy on time for 5 opportunities    Baseline 09-12-20    Time 3    Period Weeks    Status On-going      SLP SHORT TERM GOAL #3   Title Pt will use energy conversation strategies to reduce fatigue to complete daily tasks related to work and home with min A over 3 sessions    Time 3    Period Weeks    Status On-going            SLP Long Term Goals - 09/17/20 1238      SLP LONG TERM GOAL #1   Title Pt will use attention/memory compensations for  appointments, medicine management, and other daily activities with rare min A for 3 sessions.    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 Pt arrived to ST session 5 minutes late, in which pt endorsed she rushed to arrive here on time. Inconsistent use of external aids reported (i.e., snoozing alarms, forgot to use written schedule). SLP provided additional training of memory compensations for daily schedule and other alarms to promote increased carryover. SLP recommends further education and training of compensatory memory aids and attention strategies to reduce number of missed appointments, decrease tardiness to work, and increase completion of househould tasks for overall improved QOL.    Speech Therapy Frequency 2x / week    Duration 8 weeks   or 17 visits   Treatment/Interventions Compensatory strategies;Patient/family education;Functional tasks;Cueing hierarchy;Cognitive reorganization;Compensatory techniques;Internal/external aids;SLP instruction and feedback    Potential to Achieve Goals Fair    Potential Considerations Previous level of function    SLP Home Exercise Plan provided    Consulted and Agree with Plan of Care Patient           Patient will benefit from skilled therapeutic intervention in order to improve the following deficits and impairments:   Cognitive communication  deficit    Problem List Patient Active Problem List   Diagnosis Date Noted  . Excessive daytime sleepiness 09/03/2020  . Fatigue due to depression 09/03/2020  . Polypharmacy 09/03/2020  . Chronic insomnia 09/03/2020  . Class 3 severe obesity due to excess calories with serious comorbidity and body mass index (BMI) of 45.0 to 49.9 in adult [facility]) 09/03/2020  Alinda Deem, MA CCC-SLP 09/17/2020, 1:32 PM  [facility] [address] [place], [place], [address] Phone: [phone]   Fax:  [phone]   Name: Nancy Harris MRN: [id number] Date of Birth: February 19, 1966

## 2020-09-19 ENCOUNTER — Ambulatory Visit

## 2020-09-19 ENCOUNTER — Other Ambulatory Visit: Payer: Self-pay

## 2020-09-19 DIAGNOSIS — R41841 Cognitive communication deficit: Secondary | ICD-10-CM

## 2020-09-19 NOTE — Therapy (Signed)
[facility] [address] [place], [place], [address] Phone: [phone]   Fax:  [phone]  Speech Language Pathology Treatment  Patient Details  Name: Nancy Harris MRN: [id number] Date of Birth: [birth year] Referring Provider (SLP): Margette Fast   Encounter Date: 09/19/2020   End of Session - 09/19/20 1208    Visit Number 5    Number of Visits 17    Date for SLP Re-Evaluation 12/04/20    SLP Start Time 1150   arrived 5 mins late   SLP Stop Time  67    SLP Time Calculation (min) 40 min    Activity Tolerance Patient tolerated treatment well           Past Medical History:  Diagnosis Date  . Allergy   . Anemia    past hx   . Anxiety   . Arthritis   . Cancer ([place])    skin cancer  . Chronic kidney disease   . Depression   . Diabetes mellitus without complication ([place])   . GERD (gastroesophageal reflux disease)   . Hx of constipation   . Hyperlipidemia   . Hypertension   . Neuromuscular disorder (HCC)    HH, CTS, neuropathy feet and hands   . Osteoporosis   . Paget disease of bone   . Thyroid disease     Past Surgical History:  Procedure Laterality Date  . CESAREAN SECTION  10/1990  . CHOLECYSTECTOMY  10/2000  . COLONOSCOPY    . NASAL SEPTUM SURGERY     07/2008 or 07/2010- pt unsure   . TOTAL ABDOMINAL HYSTERECTOMY  10/2004  . UPPER GASTROINTESTINAL ENDOSCOPY      There were no vitals filed for this visit.   Subjective Assessment - 09/19/20 1151    Subjective "it's rainy out there"    Currently in Pain? No/denies                 ADULT SLP TREATMENT - 09/19/20 1152      General Information   Behavior/Cognition Pleasant mood;Alert;Cooperative      Treatment Provided   Treatment provided Cognitive-Linquistic      Cognitive-Linquistic Treatment   Treatment focused on Cognition;Patient/family/caregiver education    Skilled Treatment Pt arrived 5 mins late. Pt endorsed  she woke up at 10 am versus 7 am. Pt reported she left hatchback on her car open all night as she got distracted when bringing in her trashcan. Pt fell asleep on couch while reading versus getting in bed at appointed time. SLP discussed techniques to modify schedule and environment to increase success, given inconsistent carryover of recommendations/techniques reported. SLP recommended moving alarm to other side of room and completing happy light in other room, as pt reports she returns to bed and goes to sleep. SLP continues to emphasis using routine and consistnecy to improve recall and accuracy of task completion. Pt requested change ST frequency to 1x/week due to finanical constraints.      Assessment / Recommendations / Plan   Plan Continue with current plan of care      Progression Toward Goals   Progression toward goals Not progressing toward goals (comment)   inconsistent carryover of learned techniques           SLP Education - 09/19/20 1219    Education Details external aids, time management    Person(s) Educated Patient    Methods Explanation;Demonstration;Handout    Comprehension Verbalized understanding;Returned demonstration;Need further instruction  SLP Short Term Goals - 09/19/20 1208      SLP SHORT TERM GOAL #1   Title Pt will use memory compensations for appointments, medicine management, and other daily activities with min A between or in 3 sessions.    Baseline 09-12-20    Time 3   or 9 total visits   Period Weeks    Status On-going      SLP SHORT TERM GOAL #2   Title Pt will use external memory aids to arrive to work/therapy on time for 5 opportunities    Baseline 09-12-20    Time 3    Period Weeks    Status On-going      SLP SHORT TERM GOAL #3   Title Pt will use energy conversation strategies to reduce fatigue to complete daily tasks related to work and home with min A over 3 sessions    Time 3    Period Weeks    Status On-going             SLP Long Term Goals - 09/19/20 1208      SLP LONG TERM GOAL #1   Title Pt will use attention/memory compensations for appointments, medicine management, and other daily activities with rare min A for 3 sessions.    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 SLP facilitated problem solving to improve time management, with mod fading to min A required to ID solutions as pt continues to arrive late to therapy/work and is not completing important tasks in timely manner. See "skilled treatment" for further details. Pt requested to decrease frequency to 1x/week due to finanical constraints. SLP recommends further education and training of compensatory memory aids and attention strategies to reduce number of missed appointments, decrease tardiness to work, and increase completion of househould tasks for overall improved QOL.    Speech Therapy Frequency 1x /week    Duration 8 weeks   or 17 total visits   Treatment/Interventions Compensatory strategies;Patient/family education;Functional tasks;Cueing hierarchy;Cognitive reorganization;Compensatory techniques;Internal/external aids;SLP instruction and feedback    Potential to Achieve Goals Fair    Potential Considerations Previous level of function    SLP Home Exercise Plan provided    Consulted and Agree with Plan of Care Patient            Patient will benefit from skilled therapeutic intervention in order to improve the following deficits and impairments:   Cognitive communication deficit    Problem List Patient Active Problem List   Diagnosis Date Noted  . Excessive daytime sleepiness 09/03/2020  . Fatigue due to depression 09/03/2020  . Polypharmacy 09/03/2020  . Chronic insomnia 09/03/2020  . Class  3 severe obesity due to excess calories with serious comorbidity and body mass index (BMI) of 45.0 to 49.9 in adult [facility]) 09/03/2020    Alinda Deem, MA CCC-SLP 09/19/2020, 12:35 PM  [facility] [address] [place], [place], [address] Phone: [phone]   Fax:  [phone]   Name: ANNALEA ALGUIRE MRN: [id number] Date of Birth: Dec 18, 1965

## 2020-09-24 ENCOUNTER — Ambulatory Visit

## 2020-09-24 ENCOUNTER — Other Ambulatory Visit: Payer: Self-pay

## 2020-09-24 DIAGNOSIS — R41841 Cognitive communication deficit: Secondary | ICD-10-CM | POA: Diagnosis not present

## 2020-09-24 NOTE — Patient Instructions (Signed)
   Consistency and routine can help you!  On your hourly schedule, check off that you completed it and write down the time you did it  Plan ahead!! Have everything you need in place the night for. Be organized

## 2020-09-24 NOTE — Therapy (Signed)
[facility] [address] [place], [place], [address] Phone: [phone]   Fax:  [phone]  Speech Language Pathology Treatment  Patient Details  Name: ZONNIE LANDEN MRN: [id number] Date of Birth: [birth year] Referring Provider (SLP): Margette Fast   Encounter Date: 09/24/2020   End of Session - 09/24/20 1311    Visit Number 6    Number of Visits 17    Date for SLP Re-Evaluation 12/04/20    SLP Start Time 1245   pt arrived 15 mins late   SLP Stop Time  1315    SLP Time Calculation (min) 30 min    Activity Tolerance Patient tolerated treatment well           Past Medical History:  Diagnosis Date  . Allergy   . Anemia    past hx   . Anxiety   . Arthritis   . Cancer ([facility])    skin cancer  . Chronic kidney disease   . Depression   . Diabetes mellitus without complication ([place])   . GERD (gastroesophageal reflux disease)   . Hx of constipation   . Hyperlipidemia   . Hypertension   . Neuromuscular disorder (HCC)    HH, CTS, neuropathy feet and hands   . Osteoporosis   . Paget disease of bone   . Thyroid disease     Past Surgical History:  Procedure Laterality Date  . CESAREAN SECTION  10/1990  . CHOLECYSTECTOMY  10/2000  . COLONOSCOPY    . NASAL SEPTUM SURGERY     07/2008 or 07/2010- pt unsure   . TOTAL ABDOMINAL HYSTERECTOMY  10/2004  . UPPER GASTROINTESTINAL ENDOSCOPY      There were no vitals filed for this visit.   Subjective Assessment - 09/24/20 1247    Subjective Pt arrived 15 mins late. Pt stated "not good"    Currently in Pain? No/denies                 ADULT SLP TREATMENT - 09/24/20 1248      General Information   Behavior/Cognition Pleasant mood;Alert;Cooperative      Treatment Provided   Treatment provided Cognitive-Linquistic      Cognitive-Linquistic Treatment   Treatment focused on Cognition;Patient/family/caregiver education    Skilled Treatment Pt arrived  15 mins late. Pt stated "today is not going good. I didn't sleep last night." Pt reports falling asleep at 6 AM and sleeping until 10 AM, in which pt missed doctor appointment. Pt wrote out week on daily schedule for next two weeks without prompting. SLP instructed patient to problem solve current scenarios to improve time management, in which pt able to do so with rare min A. Pt aware of errors and able to verbalize how to fix them; however, decreased implementation and carryover exhibited thus far. SLP continues to emphaisze routine, consistency, and preparation. Sleep appears to be consistent barrier to success.      Assessment / Recommendations / Plan   Plan Continue with current plan of care      Progression Toward Goals   Progression toward goals Not progressing toward goals (comment)   inconsistent carryover of learned techniques           SLP Education - 09/24/20 1309    Education Details consistency and routine, external aids    Person(s) Educated Patient    Methods Explanation;Demonstration;Handout    Comprehension Verbalized understanding;Returned demonstration;Need further instruction  SLP Short Term Goals - 09/24/20 1248      SLP SHORT TERM GOAL #1   Title Pt will use memory compensations for appointments, medicine management, and other daily activities with min A between or in 3 sessions.    Baseline 09-12-20, 09-24-20    Time 2   or 9 total visits   Period Weeks    Status On-going      SLP SHORT TERM GOAL #2   Title Pt will use external memory aids to arrive to work/therapy on time for 5 opportunities    Baseline 09-12-20    Time 2    Period Weeks    Status On-going      SLP SHORT TERM GOAL #3   Title Pt will use energy conversation strategies to reduce fatigue to complete daily tasks related to work and home with min A over 3 sessions    Time 2    Period Weeks    Status On-going            SLP Long Term Goals - 09/24/20 1248      SLP LONG TERM  GOAL #1   Title Pt will use attention/memory compensations for appointments, medicine management, and other daily activities with rare min A for 3 sessions.    Baseline 09-24-20    Time 6    Period Weeks   or 17 total visits   Status On-going      SLP LONG TERM GOAL #2   Title Pt will report no episodes of arriving late to work/therapy/appointments with use of external memory aids with rare min A from family    Time 6    Period Weeks    Status On-going      SLP LONG TERM GOAL #3   Title Pt will use energy conversation strategies to reduce fatigue to complete daily tasks related to work and home with rare min A over 3 sessions    Time 6    Period Weeks    Status On-going            Plan - 09/24/20 1347    Clinical Impression Statement Maudie Mercury presents with 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 SLP facilitated problem solving to improve time management, with rare min A required to ID solutions although pt continues to arrive late to therapy/work and is not completing important tasks in timely manner. See "skilled treatment" for further details. Pt requested to decrease frequency to 1x/week due to finanical constraints. SLP recommends further education and training of compensatory memory aids and attention strategies to reduce number of missed appointments, decrease tardiness to work, and increase completion of househould tasks for overall improved QOL.    Speech Therapy Frequency 1x /week    Duration 8 weeks or 17 total visits   Treatment/Interventions Compensatory strategies;Patient/family education;Functional tasks;Cueing hierarchy;Cognitive reorganization;Compensatory techniques;Internal/external aids;SLP instruction and feedback    Potential to Achieve Goals Fair    Potential Considerations Previous level of function    SLP Home Exercise Plan provided    Consulted and Agree with Plan of Care Patient           Patient will benefit from skilled  therapeutic intervention in order to improve the following deficits and impairments:   Cognitive communication deficit    Problem List Patient Active Problem List   Diagnosis Date Noted  . Excessive daytime sleepiness 09/03/2020  . Fatigue due to depression 09/03/2020  . Polypharmacy 09/03/2020  . Chronic insomnia 09/03/2020  .  Class 3 severe obesity due to excess calories with serious comorbidity and body mass index (BMI) of 45.0 to 49.9 in adult [facility]) 09/03/2020    Scheryl Darter CCC-SLP 09/24/2020, 1:48 PM  [facility] [address] [place], [place], [address] Phone: [phone]   Fax:  [phone]   Name: LAKEDRA WASHINGTON MRN: [id number] Date of Birth: 1966/03/28

## 2020-09-26 ENCOUNTER — Ambulatory Visit

## 2020-09-30 ENCOUNTER — Encounter

## 2020-10-01 ENCOUNTER — Ambulatory Visit

## 2020-10-03 ENCOUNTER — Ambulatory Visit: Attending: Neurology

## 2020-10-03 ENCOUNTER — Other Ambulatory Visit: Payer: Self-pay

## 2020-10-03 DIAGNOSIS — R41841 Cognitive communication deficit: Secondary | ICD-10-CM | POA: Insufficient documentation

## 2020-10-03 NOTE — Therapy (Signed)
[facility] [address] [place], [place], [address] Phone: [phone]   Fax:  [phone]  Speech Language Pathology Treatment  Patient Details  Name: Nancy Harris MRN: [id number] Date of Birth: 1965-08-26 Referring Provider (SLP): Margette Fast   Encounter Date: 10/03/2020   End of Session - 10/03/20 1250    Visit Number 7    Number of Visits 17    Date for SLP Re-Evaluation 12/04/20    SLP Start Time 7619   pt arrived 11 mins late   SLP Stop Time  2    SLP Time Calculation (min) 34 min    Activity Tolerance Patient tolerated treatment well           Past Medical History:  Diagnosis Date  . Allergy   . Anemia    past hx   . Anxiety   . Arthritis   . Cancer ([facility])    skin cancer  . Chronic kidney disease   . Depression   . Diabetes mellitus without complication ([facility])   . GERD (gastroesophageal reflux disease)   . Hx of constipation   . Hyperlipidemia   . Hypertension   . Neuromuscular disorder (HCC)    HH, CTS, neuropathy feet and hands   . Osteoporosis   . Paget disease of bone   . Thyroid disease     Past Surgical History:  Procedure Laterality Date  . CESAREAN SECTION  10/1990  . CHOLECYSTECTOMY  10/2000  . COLONOSCOPY    . NASAL SEPTUM SURGERY     07/2008 or 07/2010- pt unsure   . TOTAL ABDOMINAL HYSTERECTOMY  10/2004  . UPPER GASTROINTESTINAL ENDOSCOPY      There were no vitals filed for this visit.   Subjective Assessment - 10/03/20 1241    Subjective "I'm late again. There was traffic"    Currently in Pain? No/denies                 ADULT SLP TREATMENT - 10/03/20 1243      General Information   Behavior/Cognition Pleasant mood;Cooperative;Lethargic;Requires cueing      Treatment Provided   Treatment provided Cognitive-Linquistic      Cognitive-Linquistic Treatment   Treatment focused on Cognition;Patient/family/caregiver education    Skilled Treatment Pt  arrived 11 minutes late this session due to reported traffic. Pt stated her week has been "rotten." Pt endorsed she "got in trouble at work" when pt inquired about lack of scheduled hours. Pt reports her employer stated "you are always late and I can't rely on you any more." Pt reports she has two weeks to improve or she will be written up. When SLP inquired what initiative she will take to rectify current situation, pt endorsed she "will get back on track and use the stuff you taught me" referring to memory compensations. However, when SLP inquired about use of memory compensations, pt stated "I haven't done anything for you" in regards to following HEP. SLP emphasized use of timers and stopwatch to aid "time confusion" reported, as pt unable to manage time appropriately. SLP recommends pt also follow up with therapist/psychiatrist more consistently given increased mental struggles reported related to loss of late husband.      Assessment / Recommendations / Plan   Plan Continue with current plan of care      Progression Toward Goals   Progression toward goals Not progressing toward goals (comment)   limited carryover of SLP education  SLP Education - 10/03/20 1257    Education Details memory strategies, external memory aids, timers to address "time confusion"    Person(s) Educated Patient    Methods Explanation;Demonstration    Comprehension Verbalized understanding;Returned demonstration;Need further instruction            SLP Short Term Goals - 10/03/20 1251      SLP SHORT TERM GOAL #1   Title Pt will use memory compensations for appointments, medicine management, and other daily activities with min A between or in 3 sessions.    Baseline 09-12-20, 09-24-20    Time 1   or 9 total visits   Period Weeks    Status On-going      SLP SHORT TERM GOAL #2   Title Pt will use external memory aids to arrive to work/therapy on time for 5 opportunities    Baseline 09-12-20    Time 1     Period Weeks    Status On-going      SLP SHORT TERM GOAL #3   Title Pt will use energy conversation strategies to reduce fatigue to complete daily tasks related to work and home with min A over 3 sessions    Time 1    Period Weeks    Status On-going            SLP Long Term Goals - 10/03/20 [place] #1   Title Pt will use attention/memory compensations for appointments, medicine management, and other daily activities with rare min A for 3 sessions.    Baseline 09-24-20    Time 5    Period Weeks   or 17 total visits   Status On-going      SLP LONG TERM GOAL #2   Title Pt will report no episodes of arriving late to work/therapy/appointments with use of external memory aids with rare min A from family    Time 5    Period Weeks    Status On-going      SLP LONG TERM GOAL #3   Title Pt will use energy conversation strategies to reduce fatigue to complete daily tasks related to work and home with rare min A over 3 sessions    Time 5    Period Weeks    Status On-going            Plan - 10/03/20 1251    Clinical Impression Statement Nancy Harris presents with 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 Pt previously decreased ST freq to 1x/week due to high co-pay; however, pt exhibits limited carryover and initated use of memory compensations educated by SLP. SLP educated and instructed patient to use written external aids for increased carryover; however, pt reports she has been "too busy with all the same stuff."  See "skilled treatment" for further details. SLP recommends further education and training of compensatory memory aids and attention strategies to reduce number of missed appointments, decrease tardiness to work, and increase completion of househould tasks for overall improved QOL.    Speech Therapy Frequency 1x /week   pt requested decrease from 2x to 1x/week due to high co pay   Duration 8 weeks   or 17 visits    Treatment/Interventions Compensatory strategies;Patient/family education;Functional tasks;Cueing hierarchy;Cognitive reorganization;Compensatory techniques;Internal/external aids;SLP instruction and feedback    Potential to Achieve Goals Fair    Potential Considerations Previous level of function    SLP Home Exercise Plan provided    Consulted and Agree  with Plan of Care Patient           Patient will benefit from skilled therapeutic intervention in order to improve the following deficits and impairments:   Cognitive communication deficit    Problem List Patient Active Problem List   Diagnosis Date Noted  . Excessive daytime sleepiness 09/03/2020  . Fatigue due to depression 09/03/2020  . Polypharmacy 09/03/2020  . Chronic insomnia 09/03/2020  . Class 3 severe obesity due to excess calories with serious comorbidity and body mass index (BMI) of 45.0 to 49.9 in adult [facility]) 09/03/2020    Alinda Deem, MA CCC-SLP 10/03/2020, 2:32 PM  [facility] [address] [place], [place], [address] Phone: [phone]   Fax:  [phone]   Name: Nancy Harris MRN: [id number] Date of Birth: 01-16-1966

## 2020-10-04 ENCOUNTER — Ambulatory Visit (INDEPENDENT_AMBULATORY_CARE_PROVIDER_SITE_OTHER): Admitting: Neurology

## 2020-10-04 DIAGNOSIS — G4733 Obstructive sleep apnea (adult) (pediatric): Secondary | ICD-10-CM | POA: Diagnosis not present

## 2020-10-04 DIAGNOSIS — G4719 Other hypersomnia: Secondary | ICD-10-CM

## 2020-10-04 DIAGNOSIS — F5104 Psychophysiologic insomnia: Secondary | ICD-10-CM

## 2020-10-04 DIAGNOSIS — G479 Sleep disorder, unspecified: Secondary | ICD-10-CM

## 2020-10-04 DIAGNOSIS — R413 Other amnesia: Secondary | ICD-10-CM

## 2020-10-08 ENCOUNTER — Encounter: Payer: Self-pay | Admitting: Neurology

## 2020-10-08 ENCOUNTER — Other Ambulatory Visit: Payer: Self-pay

## 2020-10-08 ENCOUNTER — Ambulatory Visit

## 2020-10-08 DIAGNOSIS — R41841 Cognitive communication deficit: Secondary | ICD-10-CM

## 2020-10-08 NOTE — Therapy (Signed)
[facility] [address] [place], [place], [address] Phone: [phone]   Fax:  [phone]  Speech Language Pathology Treatment  Patient Details  Name: Nancy Harris MRN: [id number] Date of Birth: 1965-11-10 Referring Provider (SLP): Margette Fast   Encounter Date: 10/08/2020   End of Session - 10/08/20 1229    Visit Number 8    Number of Visits 17    Date for SLP Re-Evaluation 12/04/20    SLP Start Time 61    SLP Stop Time  1315    SLP Time Calculation (min) 45 min    Activity Tolerance Patient tolerated treatment well           Past Medical History:  Diagnosis Date  . Allergy   . Anemia    past hx   . Anxiety   . Arthritis   . Cancer ([place])    skin cancer  . Chronic kidney disease   . Depression   . Diabetes mellitus without complication ([place])   . GERD (gastroesophageal reflux disease)   . Hx of constipation   . Hyperlipidemia   . Hypertension   . Neuromuscular disorder (HCC)    HH, CTS, neuropathy feet and hands   . Osteoporosis   . Paget disease of bone   . Thyroid disease     Past Surgical History:  Procedure Laterality Date  . CESAREAN SECTION  10/1990  . CHOLECYSTECTOMY  10/2000  . COLONOSCOPY    . NASAL SEPTUM SURGERY     07/2008 or 07/2010- pt unsure   . TOTAL ABDOMINAL HYSTERECTOMY  10/2004  . UPPER GASTROINTESTINAL ENDOSCOPY      There were no vitals filed for this visit.   Subjective Assessment - 10/08/20 1231    Subjective "sleepy"    Currently in Pain? No/denies                 ADULT SLP TREATMENT - 10/08/20 1230      General Information   Behavior/Cognition Pleasant mood;Cooperative;Lethargic;Requires cueing      Treatment Provided   Treatment provided Cognitive-Linquistic      Cognitive-Linquistic Treatment   Treatment focused on Cognition;Patient/family/caregiver education    Skilled Treatment Pt arrived 2 minutes early for ST session; however,  pt indicated she rushed here as she was scrolling on Facebook. Pt reports she arrived to work on time 5/6 opportunities this past week, in which she reported she was 1 minute late once and supervisor had to clock her in. Pt reports minimal sleep last night with overall lethargic demeanor noted. SLP reviewed energy conversation strategies to compensate for reduced sleep, in which pt able to ID x5  strategies she already uses and identify strats x3 she needs to utilize with min to mod A. SLP emphasized "plan, prioritize, and pace" to increase efficiency and accuracy. Pt noted with decreased attention and intermittent tangential responses this session, which may be related to lack of sleep. Pt also talked away some deficits this session, in which SLP educated patient on real life application and mental flexability. SLP again reviewed using timers and alarmst to improve time management.      Assessment / Recommendations / Plan   Plan Continue with current plan of care      Progression Toward Goals   Progression toward goals Progressing toward goals            SLP Education - 10/08/20 1259    Education Details energy conservation strategies, external memory aids  Person(s) Educated Patient    Methods Explanation;Demonstration;Handout    Comprehension Verbalized understanding;Returned demonstration;Need further instruction            SLP Short Term Goals - 10/08/20 1229      SLP SHORT TERM GOAL #1   Title Pt will use memory compensations for appointments, medicine management, and other daily activities with min A between or in 3 sessions.    Baseline 09-12-20, 09-24-20    Time --   or 9 total visits   Status Partially Met      SLP SHORT TERM GOAL #2   Title Pt will use external memory aids to arrive to work/therapy on time for 5 opportunities    Baseline 09-12-20, 10-08-20    Status Partially Met      SLP SHORT TERM GOAL #3   Title Pt will use energy conversation strategies to reduce  fatigue to complete daily tasks related to work and home with min A over 3 sessions    Baseline 10-08-20    Status Partially Met            SLP Long Term Goals - 10/08/20 1230      SLP LONG TERM GOAL #1   Title Pt will use attention/memory compensations for appointments, medicine management, and other daily activities with rare min A for 3 sessions.    Baseline 09-24-20    Time 4    Period Weeks   or 17 total visits   Status On-going      SLP LONG TERM GOAL #2   Title Pt will report no episodes of arriving late to work/therapy/appointments with use of external memory aids with rare min A from family    Time 4    Period Weeks    Status On-going      SLP LONG TERM GOAL #3   Title Pt will use energy conversation strategies to reduce fatigue to complete daily tasks related to work and home with rare min A over 3 sessions    Time 4    Period Weeks    Status On-going            Plan - 10/08/20 1313    Clinical Impression Statement Maudie Mercury presents with 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 Pt previously decreased ST freq to 1x/week due to high co-pay. Recent improvement in carryover and use of external memory aids reported, with pt arriving to work on time for last 5/6 opportunities, with incidence of arriving 1 minute late once. SLP educated energy conservation strategies to trial, with pt able to ID strategies x3 to utilize with min to mod prompting. See "skilled treatment" for further details. SLP recommends further education and training of compensatory memory aids and attention strategies to reduce number of missed appointments, decrease tardiness to work, and increase completion of househould tasks for overall improved QOL.    Speech Therapy Frequency 1x /week   pt requested decrease from 2x to 1x/week due to high co pay   Duration 8 weeks   or 17 visits   Treatment/Interventions Compensatory strategies;Patient/family education;Functional tasks;Cueing  hierarchy;Cognitive reorganization;Compensatory techniques;Internal/external aids;SLP instruction and feedback    Potential to Achieve Goals Fair    Potential Considerations Previous level of function;Ability to learn/carryover information;Financial resources    SLP Home Exercise Plan provided    Consulted and Agree with Plan of Care Patient           Patient will benefit from skilled therapeutic intervention in order to  improve the following deficits and impairments:   Cognitive communication deficit    Problem List Patient Active Problem List   Diagnosis Date Noted  . Excessive daytime sleepiness 09/03/2020  . Fatigue due to depression 09/03/2020  . Polypharmacy 09/03/2020  . Chronic insomnia 09/03/2020  . Class 3 severe obesity due to excess calories with serious comorbidity and body mass index (BMI) of 45.0 to 49.9 in adult [facility]) 09/03/2020    Alinda Deem, MA CCC-SLP 10/08/2020, 2:25 PM  [facility] [address] [place], [place], [address] Phone: [phone]   Fax:  [phone]   Name: ILETA OFARRELL MRN: [id number] Date of Birth: 18-Dec-1965

## 2020-10-10 ENCOUNTER — Ambulatory Visit

## 2020-10-15 ENCOUNTER — Ambulatory Visit

## 2020-10-15 ENCOUNTER — Other Ambulatory Visit: Payer: Self-pay

## 2020-10-15 DIAGNOSIS — R41841 Cognitive communication deficit: Secondary | ICD-10-CM

## 2020-10-15 NOTE — Therapy (Signed)
[facility] [address] [place], [place], [address] Phone: [phone]   Fax:  [phone]  Speech Language Pathology Treatment  Patient Details  Name: Nancy Harris MRN: [id number] Date of Birth: 10-24-1965 Referring Provider (SLP): Margette Fast   Encounter Date: 10/15/2020   End of Session - 10/15/20 1247    Visit Number 9    Number of Visits 17    Date for SLP Re-Evaluation 12/04/20    SLP Start Time 8675   arrived 5 mins late   SLP Stop Time  1315    SLP Time Calculation (min) 40 min    Activity Tolerance Patient limited by lethargy           Past Medical History:  Diagnosis Date  . Allergy   . Anemia    past hx   . Anxiety   . Arthritis   . Cancer ([place])    skin cancer  . Chronic kidney disease   . Depression   . Diabetes mellitus without complication ([facility])   . GERD (gastroesophageal reflux disease)   . Hx of constipation   . Hyperlipidemia   . Hypertension   . Neuromuscular disorder (HCC)    HH, CTS, neuropathy feet and hands   . Osteoporosis   . Paget disease of bone   . Thyroid disease     Past Surgical History:  Procedure Laterality Date  . CESAREAN SECTION  10/1990  . CHOLECYSTECTOMY  10/2000  . COLONOSCOPY    . NASAL SEPTUM SURGERY     07/2008 or 07/2010- pt unsure   . TOTAL ABDOMINAL HYSTERECTOMY  10/2004  . UPPER GASTROINTESTINAL ENDOSCOPY      There were no vitals filed for this visit.   Subjective Assessment - 10/15/20 1236    Subjective "I've been working every day since last Thursday"    Currently in Pain? Yes    Pain Score 2     Pain Location Head    Pain Descriptors / Indicators Aching    Pain Onset Today                 ADULT SLP TREATMENT - 10/15/20 1240      General Information   Behavior/Cognition Pleasant mood;Cooperative;Lethargic;Requires cueing      Treatment Provided   Treatment provided Cognitive-Linquistic      Cognitive-Linquistic  Treatment   Treatment focused on Cognition;Patient/family/caregiver education    Skilled Treatment Pt arrived 5 minutes late today, as she over-slept. Pt reports she arrived to work on time for last 4/5 opportunities, with one occurance of arriving 10 minutes late. Pt suggested she modify her alarm's volume, in which pt ID'd she had silenced phone given SLP prompting. Pt indicated she spent 1 hr 45 mins grocery shopping, with 10 min break to speak with acquaintance. With mod SLP prompting, pt demo'd awareness of prolonged shopping trip. SLP emphasized use of timers with realistic time frames to improve time management. Pt reports only being scheduled one shift this week, in which SLP prompted to be proactive to find out schedule and request additional shifts if needed. SLP trialed to-do list to prioritize and plan necessary activities, in which SLP provided rare min prompting to ID 3 tasks and prioritize based on importance. SLP also created visual aid to trial for tracking appropriate bedtimes as pt continues to c/o sleeping as barrier to successful adherence to routine.      Assessment / Recommendations / Plan   Plan Continue with current  plan of care      Progression Toward Goals   Progression toward goals Progressing toward goals   slow progression           SLP Education - 10/15/20 1251    Education Details memory compensations    Person(s) Educated Patient    Methods Explanation;Demonstration;Verbal cues    Comprehension Verbalized understanding;Returned demonstration;Verbal cues required;Need further instruction            SLP Short Term Goals - 10/08/20 1229      SLP SHORT TERM GOAL #1   Title Pt will use memory compensations for appointments, medicine management, and other daily activities with min A between or in 3 sessions.    Baseline 09-12-20, 09-24-20    Time --   or 9 total visits   Status Partially Met      SLP SHORT TERM GOAL #2   Title Pt will use external memory aids  to arrive to work/therapy on time for 5 opportunities    Baseline 09-12-20, 10-08-20    Status Partially Met      SLP SHORT TERM GOAL #3   Title Pt will use energy conversation strategies to reduce fatigue to complete daily tasks related to work and home with min A over 3 sessions    Baseline 10-08-20    Status Partially Met            SLP Long Term Goals - 10/15/20 1248      SLP LONG TERM GOAL #1   Title Pt will use attention/memory compensations for appointments, medicine management, and other daily activities with rare min A for 3 sessions.    Baseline 09-24-20    Time 3    Period Weeks   or 17 total visits   Status On-going      SLP LONG TERM GOAL #2   Title Pt will report no episodes of arriving late to work/therapy/appointments with use of external memory aids with rare min A from family    Time 3    Period Weeks    Status On-going      SLP LONG TERM GOAL #3   Title Pt will use energy conversation strategies to reduce fatigue to complete daily tasks related to work and home with rare min A over 3 sessions    Time 3    Period Weeks    Status On-going            Plan - 10/15/20 1322    Clinical Impression Statement Nancy Harris presents with 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 Pt arrived 5 minutes late to [place] session. Recent inconsistent carryover and use of external memory aids reported, although pt arrived to work on time for last 4/5 opportunities, with incidence of arriving 10 minutes late once. SLP educated additional visual memory aids to trial, including to-do lists and visual tracker for bedtime for increased accountability. See "skilled treatment" for further details. SLP recommends further education and training of compensatory memory aids and attention strategies to reduce number of missed appointments, decrease tardiness to work, and increase completion of househould tasks for overall improved QOL.    Speech Therapy Frequency 1x /week   pt  requested decrease from 2x to 1x/week due to high co pay   Duration 8 weeks    Treatment/Interventions Compensatory strategies;Patient/family education;Functional tasks;Cueing hierarchy;Cognitive reorganization;Compensatory techniques;Internal/external aids;SLP instruction and feedback    Potential to Achieve Goals Fair    Potential Considerations Previous level of function;Ability to learn/carryover  information;Financial resources;Cooperation/participation level    SLP Home Exercise Plan provided    Consulted and Agree with Plan of Care Patient           Patient will benefit from skilled therapeutic intervention in order to improve the following deficits and impairments:   Cognitive communication deficit    Problem List Patient Active Problem List   Diagnosis Date Noted  . Excessive daytime sleepiness 09/03/2020  . Fatigue due to depression 09/03/2020  . Polypharmacy 09/03/2020  . Chronic insomnia 09/03/2020    Alinda Deem, MA CCC-SLP 10/15/2020, 1:25 PM  [facility] [address] [place], [place], [address] Phone: [phone]   Fax:  [phone]   Name: Nancy Harris MRN: [id number] Date of Birth: Feb 19, 1966

## 2020-10-16 NOTE — Addendum Note (Signed)
Addended by: Larey Seat on: 10/16/2020 05:51 PM   Modules accepted: Orders

## 2020-10-16 NOTE — Procedures (Signed)
PATIENT'S NAME:  Nancy Harris, Nancy Harris DOB:      [birth year]      MR#:    [id number]     DATE OF RECORDING: 10/04/2020  Richard Miu REFERRING M.D.:  Lenor Coffin, MD, Butler Denmark, NP Study Performed:  Titration to positive airway pressure  HISTORY:  Nancy Harris is a 55 year-old Caucasian widowed female and seen here by Dr Jannifer Franklin for memory loss, and polypharmacy. She reports her problem is chronic insomnia- she returns today, 10-04-2020 after her PSG from 09-04-2020 documented sleep apnea to be present.  This patient has moderate severe Obstructive Sleep Apnea (OSA) at an AHI of 23.8/h and her RDI would be higher than that, based on loud snoring. 2. Frequent PLMs with few arousals constituting mild Periodic Limb Movement Disorder (PLMD)to be present. These PLMs all clustered between 1 and 1.30 AM.  3. Loud Primary Snoring, UARS.  She relies on sleep medication for many years, is treated by psychiatry- Ambien CR 12.5 mg. Recently she cut down to 6.25 mg -this has not worked. She struggles with chronic insomnia.  In her youth she was a sleepwalker, nightmares and night terrors were present.  MHx: Super-obesity, Anxiety, Bipolar Depression, grief reaction, GERD, DM2 and Paget's disease, HTN ,CKD  The patient endorsed the Epworth Sleepiness Scale at 18/24 points and the Fatigue Score at 61 points.   The patient's weight 179 pounds with a height of 60 (inches), resulting in a BMI of 35.1 kg/m2. The patient's neck circumference measured 17 inches.  CURRENT MEDICATIONS: Adderall, Aspirin, Biotin, Rexulti, Wellbutrin, Calcium, Klonopin, Welchol, Trulicity, Zetia, Iron, Neurontin, Xyzal, Synthroid, Melatonin, Glucophage, Toprol, Zinc, Omega 3, Protonix, Actos, Effexor-XR, Vitamin-D, Ambien, Calcium Vitamin D3    PROCEDURE:  This is a multichannel digital polysomnogram utilizing the SomnoStar 11.2 system.  Electrodes and sensors were applied and monitored per AASM Specifications.   EEG, EOG,  Chin and Limb EMG, were sampled at 200 Hz.  ECG, Snore and Nasal Pressure, Thermal Airflow, Respiratory Effort, CPAP Flow and Pressure, Oximetry was sampled at 50 Hz. Digital video and audio were recorded.      BASELINE STUDY:  CPAP was initiated under a ResMed N 20 Medium size mask at 5 cmH20 with heated humidity per AASM split night standards and pressure was advanced to 9 cmH20 because of hypopneas, apneas and desaturations.  At a PAP pressure of 0 cmH20, there was a reduction of the AHI to 0 with improvement of sleep apnea.  Lights Out was at 22:27 and Lights On at 05:41. Total recording time (TRT) was 434.5 minutes, with a total sleep time (TST) of 421 minutes. The patient's sleep latency was 8.5 minutes. REM latency was 175 minutes.  The sleep efficiency was 96.9 %.    SLEEP ARCHITECTURE: WASO (Wake after sleep onset) was 5 minutes.  There were 38.5 minutes in Stage N1, 304 minutes Stage N2, 6.5 minutes Stage N3 and 72 minutes in Stage REM.  The percentage of Stage N1 was 9.1%, Stage N2 was 72.2%, Stage N3 was 1.5% and Stage R (REM sleep) was 17.1%. The sleep architecture was notable for REM rebound.   RESPIRATORY ANALYSIS:  There was a total of 15 respiratory events: 0 obstructive apneas, 0 central apneas and 0 mixed apneas with a total of 0 apneas and an apnea index (AI) of 0 /hour. There were 15 hypopneas with a hypopnea index of 2.1/hour. The patient also had 0 respiratory event related arousals (RERAs).      The  total APNEA/HYPOPNEA INDEX  (AHI) was 2.1 /hour and the total RESPIRATORY DISTURBANCE INDEX was 2.1 /hour  4 events occurred in REM sleep and 11 events in NREM. The REM AHI was 3.3 /hour versus a non-REM AHI of 1.9 /hour.  The patient spent 421 minutes of total sleep time in the supine position and 0 minutes in non-supine. The supine AHI was 2.1, versus a non-supine AHI of 0.0.  OXYGEN SATURATION & C02:  The baseline 02 saturation was 96%, with the lowest being 91%. The arousals were  noted as: 15 were spontaneous, 8 were associated with PLMs, 0 were associated with respiratory events. The patient had a total of 416 Periodic Limb Movements. The Periodic Limb Movement (PLM) Arousal index was 1.1 /hour. EKG was in keeping with normal sinus rhythm (NSR).  DIAGNOSIS 1. Obstructive Sleep Apnea responded well to 9 cm water  2. Periodic Limb Movement Disorder persisted and increased under CPAP and persisted into REM sleep. REM behavior disorder will need to be considered.    PLANS/RECOMMENDATIONS: The patient will be set up with an auto-titration CPAP from 6-12 cm water pressure, 2 cm EPR, heated humidification and mask of choice, the patient did well under a medium size N20 ResMed nasal mask, even in supine position and in REM.   1. The patient should avoid evening sedatives, hypnotics, and alcohol beverage consumption 2. CPAP therapy compliance is defined as 4 hours or more of nightly use.   DISCUSSION: A follow up appointment for CPAP will be scheduled in the Sleep Clinic at [facility]. Dr. Jannifer Franklin remains primary neurologist.   Please call [phone] with any questions.      I certify that I have reviewed the entire raw data recording prior to the issuance of this report in accordance with the Standards of Accreditation of the American Academy of Sleep Medicine (AASM)  Larey Seat, M.D. Diplomat, Tax adviser of Psychiatry and Neurology  Diplomat, Tax adviser of Sleep Medicine Market researcher, [employer] [facility] at [employer]

## 2020-10-16 NOTE — Progress Notes (Signed)
DIAGNOSIS 1. Obstructive Sleep Apnea responded well to 9 cm water  2. Periodic Limb Movement Disorder persisted and increased under CPAP and persisted into REM sleep. REM behavior disorder will need to be considered.    PLANS/RECOMMENDATIONS: The patient will be set up with an auto-titration CPAP from 6-12 cm water pressure, 2 cm EPR, heated humidification and mask of choice, the patient did well under a medium size N20 ResMed nasal mask, even in supine position and in REM.   1. The patient should avoid evening sedatives, hypnotics, and alcohol beverage consumption 2. CPAP therapy compliance is defined as 4 hours or more of nightly use. 3.         REM behavior disorder to be discussed.   DISCUSSION: A follow up appointment for CPAP will be scheduled in the Sleep Clinic at [facility]. Dr. Jannifer Franklin remains primary neurologist.   Please call [phone] with any questions.

## 2020-10-17 ENCOUNTER — Telehealth: Payer: Self-pay | Admitting: Neurology

## 2020-10-17 ENCOUNTER — Ambulatory Visit

## 2020-10-17 NOTE — Telephone Encounter (Signed)
-----   Message from Larey Seat, MD sent at 10/16/2020  5:51 PM EDT ----- DIAGNOSIS 1. Obstructive Sleep Apnea responded well to 9 cm water  2. Periodic Limb Movement Disorder persisted and increased under CPAP and persisted into REM sleep. REM behavior disorder will need to be considered.    PLANS/RECOMMENDATIONS: The patient will be set up with an auto-titration CPAP from 6-12 cm water pressure, 2 cm EPR, heated humidification and mask of choice, the patient did well under a medium size N20 ResMed nasal mask, even in supine position and in REM.   1. The patient should avoid evening sedatives, hypnotics, and alcohol beverage consumption 2. CPAP therapy compliance is defined as 4 hours or more of nightly use. 3.         REM behavior disorder to be discussed.   DISCUSSION: A follow up appointment for CPAP will be scheduled in the Sleep Clinic at [facility]. Dr. Jannifer Franklin remains primary neurologist.   Please call [phone] with any questions.

## 2020-10-17 NOTE — Telephone Encounter (Signed)
Called patient to discuss sleep study results. No answer at this time. LVM for the patient to call back.   

## 2020-10-18 ENCOUNTER — Encounter: Payer: Self-pay | Admitting: Neurology

## 2020-10-22 ENCOUNTER — Telehealth: Payer: Self-pay | Admitting: Neurology

## 2020-10-22 ENCOUNTER — Ambulatory Visit

## 2020-10-22 ENCOUNTER — Other Ambulatory Visit: Payer: Self-pay

## 2020-10-22 DIAGNOSIS — R41841 Cognitive communication deficit: Secondary | ICD-10-CM | POA: Diagnosis not present

## 2020-10-22 NOTE — Patient Instructions (Addendum)
  Bring the folder inside!  Put checklist/routine schedule out where you will see it and interact with it.  Move your phone across the room so you have to get up to turn it off.  Set timer for computer time (ex: 1 hour). Make sure you are listening to the timer.

## 2020-10-22 NOTE — Therapy (Signed)
[facility] [address] [place], [place], [address] Phone: [phone]   Fax:  [phone]  Speech Language Pathology Treatment  Patient Details  Name: Nancy Harris MRN: [id number] Date of Birth: 1965/10/22 Referring Provider (SLP): Margette Fast   Encounter Date: 10/22/2020   End of Session - 10/22/20 1257    Visit Number 10    Number of Visits 17    Date for SLP Re-Evaluation 12/04/20    SLP Start Time 1233   pt arrived 3 mins late   SLP Stop Time  1315    SLP Time Calculation (min) 42 min    Activity Tolerance Patient limited by lethargy           Past Medical History:  Diagnosis Date  . Allergy   . Anemia    past hx   . Anxiety   . Arthritis   . Cancer ([facility])    skin cancer  . Chronic kidney disease   . Depression   . Diabetes mellitus without complication ([place])   . GERD (gastroesophageal reflux disease)   . Hx of constipation   . Hyperlipidemia   . Hypertension   . Neuromuscular disorder (HCC)    HH, CTS, neuropathy feet and hands   . Osteoporosis   . Paget disease of bone   . Thyroid disease     Past Surgical History:  Procedure Laterality Date  . CESAREAN SECTION  10/1990  . CHOLECYSTECTOMY  10/2000  . COLONOSCOPY    . NASAL SEPTUM SURGERY     07/2008 or 07/2010- pt unsure   . TOTAL ABDOMINAL HYSTERECTOMY  10/2004  . UPPER GASTROINTESTINAL ENDOSCOPY      There were no vitals filed for this visit.   Subjective Assessment - 10/22/20 1232    Subjective Pt arrived 3 mins late. "nothing much"    Currently in Pain? No/denies                 ADULT SLP TREATMENT - 10/22/20 1234      Treatment Provided   Treatment provided Cognitive-Linquistic      Cognitive-Linquistic Treatment   Treatment focused on Cognition;Patient/family/caregiver education    Skilled Treatment Pt arrived 3 mins late to [facility] session. Pt reported she arrived late to work 2/2 times last week by 7 mins  and 20 mins. Pt endorses she did not hear alarm. Pt brought back visual aid tracker for bedtime, in which pt went to bed on time for 2/7 opportunities. Pt stated she forgot the folder in her car that had the visual aids with impacted completion. Pt also noted bedtime (11 pm) was too early. SLP educated patient on making adjustments to routine if not applicable instead of waiting for prompting. Pt reports she followed to-do list with some success. SLP recommended patient to set alarm clock across room to aid getting up, set visual aids in appropriate locations to cue self, and use timer to limit amount of time spent on computer.      Assessment / Recommendations / Plan   Plan Continue with current plan of care      Progression Toward Goals   Progression toward goals Not progressing toward goals (comment)            SLP Education - 10/22/20 1255    Education Details external memory aids, consistency for routine    Person(s) Educated Patient    Methods Explanation;Demonstration;Handout    Comprehension Verbalized understanding;Returned demonstration;Need further instruction  SLP Short Term Goals - 10/08/20 1229      SLP SHORT TERM GOAL #1   Title Pt will use memory compensations for appointments, medicine management, and other daily activities with min A between or in 3 sessions.    Baseline 09-12-20, 09-24-20    Time --   or 9 total visits   Status Partially Met      SLP SHORT TERM GOAL #2   Title Pt will use external memory aids to arrive to work/therapy on time for 5 opportunities    Baseline 09-12-20, 10-08-20    Status Partially Met      SLP SHORT TERM GOAL #3   Title Pt will use energy conversation strategies to reduce fatigue to complete daily tasks related to work and home with min A over 3 sessions    Baseline 10-08-20    Status Partially Met            SLP Long Term Goals - 10/22/20 1257      SLP LONG TERM GOAL #1   Title Pt will use attention/memory  compensations for appointments, medicine management, and other daily activities with rare min A for 3 sessions.    Baseline 09-24-20    Time 2    Period Weeks   or 17 total visits   Status On-going      SLP LONG TERM GOAL #2   Title Pt will report 2 or less episodes of arriving late to work/therapy/appointments with use of external memory aids with rare min A for 5 opportunities    Time 2    Period Weeks    Status Revised      SLP LONG TERM GOAL #3   Title Pt will use energy conversation strategies to reduce fatigue to complete daily tasks related to work and home with rare min A over 3 sessions    Time 2    Period Weeks    Status On-going            Plan - 10/22/20 Nancy Harris presents with 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 Pt arrived 3 minutes late to [place] session. Persistent inconsistent carryover and use of external memory aids reported. Pt did not arrive to work on time for last 2 opportunities. SLP reviewed techniques and multiple visual memory aids to implement, including to-do lists and visual tracker for bedtime for increased accountability. See "skilled treatment" for further details. SLP recommends further education and training of compensatory memory aids and attention strategies to reduce number of missed appointments, decrease tardiness to work, and increase completion of househould tasks for overall improved QOL.    Speech Therapy Frequency 1x /week   pt requested decrease from 2x to 1x/week due to high co pay   Duration 8 weeks    Treatment/Interventions Compensatory strategies;Patient/family education;Functional tasks;Cueing hierarchy;Cognitive reorganization;Compensatory techniques;Internal/external aids;SLP instruction and feedback    Potential to Achieve Goals Fair    Potential Considerations Previous level of function;Ability to learn/carryover information;Financial  resources;Cooperation/participation level    SLP Home Exercise Plan provided    Consulted and Agree with Plan of Care Patient           Patient will benefit from skilled therapeutic intervention in order to improve the following deficits and impairments:   Cognitive communication deficit    Problem List Patient Active Problem List   Diagnosis Date Noted  . Excessive daytime sleepiness 09/03/2020  . Fatigue due to depression 09/03/2020  .  Polypharmacy 09/03/2020  . Chronic insomnia 09/03/2020    Alinda Deem, MA CCC-SLP 10/22/2020, 2:19 PM  [facility] [address] [place], [place], [address] Phone: [phone]   Fax:  [phone]   Name: Nancy Harris MRN: [id number] Date of Birth: [birth year]

## 2020-10-22 NOTE — Telephone Encounter (Signed)
Patient is in the lobby requesting nurse go over sleep study results with her. She was next door for an apt and just thought it would be okay to stop in. She is able to wait 20 minutes or more. Best call back is [phone]

## 2020-10-23 ENCOUNTER — Ambulatory Visit
Admission: RE | Admit: 2020-10-23 | Discharge: 2020-10-23 | Disposition: A | Discharge status: Home / Self Care | Source: Ambulatory Visit | Attending: Family Medicine | Admitting: Family Medicine

## 2020-10-23 DIAGNOSIS — Z1231 Encounter for screening mammogram for malignant neoplasm of breast: Secondary | ICD-10-CM

## 2020-10-23 NOTE — Telephone Encounter (Signed)
**   See previous note on sleep study results. **

## 2020-10-23 NOTE — Telephone Encounter (Signed)
Made 2nd attempt to call and discuss with the patient the SSR. There was no answer. LVM asking the pt to call back.

## 2020-10-24 ENCOUNTER — Ambulatory Visit

## 2020-10-25 ENCOUNTER — Other Ambulatory Visit: Payer: Self-pay | Admitting: Family Medicine

## 2020-10-25 DIAGNOSIS — R928 Other abnormal and inconclusive findings on diagnostic imaging of breast: Secondary | ICD-10-CM

## 2020-10-29 ENCOUNTER — Ambulatory Visit

## 2020-10-31 NOTE — Telephone Encounter (Signed)
LVM advising her to call back and schedule a FU with Dr Brett Fairy. Advised her that Dr Dohmeier's RN will be back in office tomorrow, if she wants to call back then.

## 2020-10-31 NOTE — Telephone Encounter (Signed)
Patient called, reviewed sleep study results per Dr Barbette Merino 4/19/222 note. Gave her # to sleep lab to call and make FU appointment. Patient verbalized understanding, appreciation.

## 2020-11-01 ENCOUNTER — Encounter: Payer: Self-pay | Admitting: Neurology

## 2020-11-01 NOTE — Telephone Encounter (Signed)
Order has been sent to [facility]) for the patient. I have completed a letter and forwarded to the patient reminding to schedule a initial cpap follow up once she has been set up on the machine.  I will place the letter in the mail as well.

## 2020-11-06 ENCOUNTER — Encounter: Payer: Self-pay | Admitting: Neurology

## 2020-11-06 ENCOUNTER — Other Ambulatory Visit: Payer: Self-pay

## 2020-11-06 ENCOUNTER — Ambulatory Visit: Attending: Neurology

## 2020-11-06 ENCOUNTER — Ambulatory Visit (INDEPENDENT_AMBULATORY_CARE_PROVIDER_SITE_OTHER): Admitting: Neurology

## 2020-11-06 VITALS — Ht 60.0 in | Wt 184.2 lb

## 2020-11-06 DIAGNOSIS — R41841 Cognitive communication deficit: Secondary | ICD-10-CM | POA: Insufficient documentation

## 2020-11-06 DIAGNOSIS — R413 Other amnesia: Secondary | ICD-10-CM | POA: Insufficient documentation

## 2020-11-06 DIAGNOSIS — G4733 Obstructive sleep apnea (adult) (pediatric): Secondary | ICD-10-CM

## 2020-11-06 DIAGNOSIS — G4730 Sleep apnea, unspecified: Secondary | ICD-10-CM

## 2020-11-06 DIAGNOSIS — F5104 Psychophysiologic insomnia: Secondary | ICD-10-CM | POA: Diagnosis not present

## 2020-11-06 HISTORY — DX: Other amnesia: R41.3

## 2020-11-06 HISTORY — DX: Obstructive sleep apnea (adult) (pediatric): G47.33

## 2020-11-06 HISTORY — DX: Sleep apnea, unspecified: G47.30

## 2020-11-06 NOTE — Progress Notes (Signed)
Reason for visit: Memory disturbance, sleep apnea  Nancy Harris is an 55 y.o. female  History of present illness:  Nancy Harris is a 55 year old right-handed white female with history of bipolar disorder.  The patient is followed through [facility] for her depression.  The patient has been noted to have significant fatigue during the day.  She was set up for sleep evaluation at showed evidence of sleep apnea, she has not yet been placed on CPAP.  The patient has chronic insomnia, she takes melatonin around 8 PM but does not really get sleep until 1 or 2 AM.  On weekends, she may sleep until afternoon.  She has to go to work at around 11 AM, but she oftentimes shows up late.  She is now getting cognitive training through speech therapy, this is helping some.  She has had a lifelong problem with time management.  She was taken off of her Topamax which seems to have helped some with her headaches but she still has frequent early morning headaches that she wakes up with.  The patient reports a 20 pound weight gain in the last year.  She still has some ongoing chronic fatigue throughout the day but she takes the clonazepam 1 mg in the morning.  She is off of her sleeping pills at night, she has reduced the gabapentin to 300 mg twice daily.  She has had some increase in anxiety on the lower dose of gabapentin.  The patient returns to the office today for an evaluation.  Past Medical History:  Diagnosis Date  . Allergy   . Anemia    past hx   . Anxiety   . Arthritis   . Cancer ([place])    skin cancer  . Chronic kidney disease   . Depression   . Diabetes mellitus without complication ([place])   . GERD (gastroesophageal reflux disease)   . Hx of constipation   . Hyperlipidemia   . Hypertension   . Memory difficulty 11/06/2020  . Neuromuscular disorder (HCC)    HH, CTS, neuropathy feet and hands   . Osteoporosis   . Paget disease of bone   . Sleep apnea 11/06/2020  . Thyroid disease      Past Surgical History:  Procedure Laterality Date  . CESAREAN SECTION  10/1990  . CHOLECYSTECTOMY  10/2000  . COLONOSCOPY    . NASAL SEPTUM SURGERY     07/2008 or 07/2010- pt unsure   . TOTAL ABDOMINAL HYSTERECTOMY  10/2004  . UPPER GASTROINTESTINAL ENDOSCOPY      Family History  Problem Relation Age of Onset  . Colon cancer Paternal Grandmother   . Alzheimer's disease Mother   . Breast cancer Neg Hx   . Colon polyps Neg Hx   . Esophageal cancer Neg Hx   . Rectal cancer Neg Hx   . Stomach cancer Neg Hx     Social history:  reports that she has never smoked. She has never used smokeless tobacco. She reports that she does not drink alcohol and does not use drugs.    Allergies  Allergen Reactions  . Latex Itching    Medications:  Prior to Admission medications   Medication Sig Start Date End Date Taking? Authorizing Provider  amphetamine-dextroamphetamine (ADDERALL XR) 25 MG 24 hr capsule  11/15/17   [provider]  aspirin EC 81 MG tablet Take 81 mg by mouth at bedtime.    [provider]  BIOTIN PO Take 5,000 mcg by  mouth at bedtime.    [provider]  brexpiprazole (REXULTI) 2 MG TABS tablet Take 2 mg by mouth at bedtime.    [provider]  buPROPion (WELLBUTRIN XL) 300 MG 24 hr tablet Take 300 mg by mouth daily after breakfast.    [provider]  Calcium Carb-Cholecalciferol (CALCIUM-VITAMIN D3) 600-400 MG-UNIT CAPS Frequency:two times daily with food   Dosage:600-400   MG-UNIT  Instructions:Calcium 600/Vitamin D (600-400MG -UNIT TABS, one Oral two times daily with food)  Note:    [provider]  clonazePAM (KLONOPIN) 1 MG tablet Take 1 mg by mouth daily after breakfast.    [provider]  colesevelam (WELCHOL) 625 MG tablet Take 1,875 mg by mouth 2 (two) times daily with a meal.    [provider]  Cyanocobalamin (VITAMIN B-12 PO) Take 1,000 mcg by mouth every morning.    [provider]  Dulaglutide (TRULICITY) 3 YK/5.9DJ SOPN Inject 3 mg into the skin once a week.    [provider]  ezetimibe (ZETIA) 10 MG tablet  03/26/19   [provider]  Ferrous Sulfate (IRON) 325 (65 Fe) MG TABS Take 1 tablet by mouth at bedtime.    [provider]  gabapentin (NEURONTIN) 600 MG tablet Take 600 mg by mouth 2 (two) times daily.    [provider]  levocetirizine (XYZAL) 5 MG tablet  01/27/19   [provider]  levothyroxine (SYNTHROID, LEVOTHROID) 137 MCG tablet Take 137 mcg by mouth daily before breakfast.    [provider]  Melatonin 10 MG TABS Take 10 mg by mouth at bedtime.    [provider]  metFORMIN (GLUCOPHAGE) 500 MG tablet Take 500 mg by mouth 2 (two) times daily with a meal.    [provider]  metoprolol succinate (TOPROL-XL) 50 MG 24 hr tablet Take 50 mg by mouth daily after breakfast. Take with or immediately following a meal.    [provider]  Multiple Vitamins-Minerals (ZINC PO) Take 50 mg by mouth daily. After evening meal    [provider]  Omega-3 Fatty Acids (OMEGA 3 PO) Take 1,000 mg by mouth daily. After evening meal    [provider]  pantoprazole (PROTONIX) 40 MG tablet Take 1 tablet (40 mg total) by mouth daily. 12/06/18   Jackquline Denmark, MD  pioglitazone (ACTOS) 45 MG tablet Take 45 mg by mouth every morning.    [provider]  venlafaxine XR (EFFEXOR-XR) 150 MG 24 hr capsule Take 300 mg by mouth daily with breakfast.    [provider]  Vitamin D, Ergocalciferol, (DRISDOL) 1.25 MG (50000 UT) CAPS capsule Take 50,000 Units by mouth once a week. 03/26/19   [provider]  zolpidem (AMBIEN CR) 12.5 MG CR tablet Take 6.25 mg by mouth at bedtime.    [provider]    ROS:  Out of a complete 14 system review of symptoms, the patient complains only of the following symptoms, and all other reviewed systems are  negative.  Fatigue Anxiety, depression Weight gain Headache  Height 5' (1.524 m), weight 184 lb 3.2 oz (83.6 kg).  Physical Exam  General: The patient is alert and cooperative at the time of the examination.  The patient is obese.  Skin: No significant peripheral edema is noted.   Neurologic Exam  Mental status: The patient is alert and oriented x 3 at the time of the examination. The patient has apparent normal recent and remote memory, with an  apparently normal attention span and concentration ability.   Cranial nerves: Facial symmetry is present. Speech is normal, no aphasia or dysarthria is noted. Extraocular movements are full. Visual fields are full.  Motor: The patient has good strength in all 4 extremities.  Sensory examination: Soft touch sensation is symmetric on the face, arms, and legs.  Coordination: The patient has good finger-nose-finger and heel-to-shin bilaterally.  Gait and station: The patient has a normal gait. Tandem gait is normal. Romberg is negative. No drift is seen.  Reflexes: Deep tendon reflexes are symmetric.   MRI brain 07/11/20:  IMPRESSION: Slightly abnormal MRI scan of the brain showing nonspecific tiny subcortical white matter hyperintensities with a differential discussed above.  * MRI scan images were reviewed online. I agree with the written report.    Assessment/Plan:  1.  Bipolar disorder, depression  2.  Sleep apnea  3.  Memory disturbance  4.  Chronic insomnia  The patient will be sent for neuropsychological testing.  The patient hopefully will improve some with her fatigue and her early morning headaches on CPAP.  She is getting cognitive training which is good.  I have instructed her to initiate a regular exercise program, and get on a low carbohydrate diet to help her lose weight.  The exercise may help her fatigue, sleep problems, and promote weight loss.  She will follow-up here in 6 months.  I would wonder whether or  not the Effexor could be switched over to trazodone in the evening to help her sleep.  Jill Alexanders MD 11/06/2020 8:59 AM  [facility] [address] [place],  [address]  Phone [phone] Fax [phone]

## 2020-11-06 NOTE — Therapy (Signed)
[facility] [address] [place], [place], [address] Phone: [phone]   Fax:  [phone]  Speech Language Pathology Treatment  Patient Details  Name: Nancy Harris MRN: [id number] Date of Birth: [birth year] Referring Provider (SLP): Margette Fast   Encounter Date: 11/06/2020   End of Session - 11/06/20 1157    Visit Number 11    Number of Visits 17    Date for SLP Re-Evaluation 12/04/20    SLP Start Time 9702    SLP Stop Time  1230    SLP Time Calculation (min) 45 min    Activity Tolerance Patient tolerated treatment well           Past Medical History:  Diagnosis Date  . Allergy   . Anemia    past hx   . Anxiety   . Arthritis   . Cancer ([place])    skin cancer  . Chronic kidney disease   . Depression   . Diabetes mellitus without complication ([place])   . GERD (gastroesophageal reflux disease)   . Hx of constipation   . Hyperlipidemia   . Hypertension   . Memory difficulty 11/06/2020  . Neuromuscular disorder (HCC)    HH, CTS, neuropathy feet and hands   . Osteoporosis   . Paget disease of bone   . Sleep apnea 11/06/2020  . Thyroid disease     Past Surgical History:  Procedure Laterality Date  . CESAREAN SECTION  10/1990  . CHOLECYSTECTOMY  10/2000  . COLONOSCOPY    . NASAL SEPTUM SURGERY     07/2008 or 07/2010- pt unsure   . TOTAL ABDOMINAL HYSTERECTOMY  10/2004  . UPPER GASTROINTESTINAL ENDOSCOPY      There were no vitals filed for this visit.   Subjective Assessment - 11/06/20 1147    Subjective Pt arrived 2 mins early. "I was already here this morning for MD apt next door"    Currently in Pain? No/denies                 ADULT SLP TREATMENT - 11/06/20 1150      General Information   Behavior/Cognition Pleasant mood;Cooperative;Lethargic;Requires cueing      Treatment Provided   Treatment provided Cognitive-Linquistic      Cognitive-Linquistic Treatment   Treatment  focused on Cognition;Patient/family/caregiver education    Skilled Treatment Pt arrived 2 minutes early to ST session. Pt stated she arrived on time to MD apt earlier this morning, as her sister took her. Pt missed last ST session as she would arrive after 15 minute grace period. Written to-do list provided in which ~75% of tasks completed. Improved timeliness to work reported, in which boss provided compliment re: arriving to work on time. Waking up has been easier since tapering off sleeping medication; however, pt questions anxiety/depression impacting her ability to get out of bed. Pt continues to report difficulty falling asleep. SLP reviewed prioritizing needs and not avoiding important tasks by shifting them back in timeline. SLP engaged patient in problem solving task to factor in exercise into daily routine. SLP re-educated patient on external aids are only effective if she implements them, in which pt verbalized understanding.      Assessment / Recommendations / Plan   Plan Continue with current plan of care      Progression Toward Goals   Progression toward goals Progressing toward goals   slow progression           SLP Education - 11/06/20 1157  Education Details external aids, consistency    Person(s) Educated Patient    Methods Explanation;Demonstration;Handout;Verbal cues    Comprehension Verbalized understanding;Returned demonstration;Verbal cues required            SLP Short Term Goals - 10/08/20 1229      SLP SHORT TERM GOAL #1   Title Pt will use memory compensations for appointments, medicine management, and other daily activities with min A between or in 3 sessions.    Baseline 09-12-20, 09-24-20    Time --   or 9 total visits   Status Partially Met      SLP SHORT TERM GOAL #2   Title Pt will use external memory aids to arrive to work/therapy on time for 5 opportunities    Baseline 09-12-20, 10-08-20    Status Partially Met      SLP SHORT TERM GOAL #3   Title  Pt will use energy conversation strategies to reduce fatigue to complete daily tasks related to work and home with min A over 3 sessions    Baseline 10-08-20    Status Partially Met            SLP Long Term Goals - 11/06/20 1157      SLP LONG TERM GOAL #1   Title Pt will use attention/memory compensations for appointments, medicine management, and other daily activities with rare min A for 3 sessions.    Baseline 09-24-20, 11-06-20 (alarms)    Time 1    Period Weeks   or 17 total visits   Status On-going      SLP LONG TERM GOAL #2   Title Pt will report 2 or less episodes of arriving late to work/therapy/appointments with use of external memory aids with rare min A for 5 opportunities    Baseline 11-06-20    Time 1    Period Weeks    Status On-going      SLP LONG TERM GOAL #3   Title Pt will use energy conversation strategies to reduce fatigue to complete daily tasks related to work and home with rare min A over 3 sessions    Time 1    Period Weeks    Status On-going            Plan - 11/06/20 1250    Clinical Impression Statement Maudie Mercury presents with 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 Pt arrived 2 mins early to ST session. Slighlty improved consistency and carryover of external memory aids reported. Pt arrived to work/appt on time for last 5 opportunities. SLP reviewed techniques and multiple visual memory aids to implement, including to-do lists with priorization for increased accountability. See "skilled treatment" for further details. SLP recommends further education and training of compensatory memory aids and attention strategies to reduce number of missed appointments, decrease tardiness to work, and increase completion of househould tasks for overall improved QOL.    Speech Therapy Frequency 1x /week   pt requested decrease from 2x to 1x/week due to high co pay   Duration 8 weeks    Treatment/Interventions Compensatory  strategies;Patient/family education;Functional tasks;Cueing hierarchy;Cognitive reorganization;Compensatory techniques;Internal/external aids;SLP instruction and feedback    Potential to Achieve Goals Fair    Potential Considerations Previous level of function;Ability to learn/carryover information;Financial resources;Cooperation/participation level    SLP Home Exercise Plan provided    Consulted and Agree with Plan of Care Patient           Patient will benefit from skilled therapeutic intervention in order  to improve the following deficits and impairments:   Cognitive communication deficit    Problem List Patient Active Problem List   Diagnosis Date Noted  . Memory difficulty 11/06/2020  . Sleep apnea 11/06/2020  . Excessive daytime sleepiness 09/03/2020  . Fatigue due to depression 09/03/2020  . Polypharmacy 09/03/2020  . Chronic insomnia 09/03/2020    Alinda Deem, MA CCC-SLP 11/06/2020, 12:54 PM  [facility] [address] [place], [place], [address] Phone: [phone]   Fax:  [phone]   Name: NATALIN BIBLE MRN: [id number] Date of Birth: 1966-02-15

## 2020-11-06 NOTE — Patient Instructions (Signed)
  Look into places to walk/exercise before next ST session and ask all possible family members if they would be available to walk 1-2x/week. This is a priority for your health and wellness, so make it happen and don't make excuses.

## 2020-11-09 ENCOUNTER — Encounter: Payer: Self-pay | Admitting: Psychology

## 2020-11-12 ENCOUNTER — Ambulatory Visit

## 2020-11-12 ENCOUNTER — Other Ambulatory Visit: Payer: Self-pay

## 2020-11-12 DIAGNOSIS — R41841 Cognitive communication deficit: Secondary | ICD-10-CM

## 2020-11-12 NOTE — Therapy (Signed)
[facility] [address] [place], [place], [address] Phone: [phone]   Fax:  [phone]  Speech Language Pathology Treatment  Patient Details  Name: Nancy Harris MRN: [id number] Date of Birth: 07-Jul-1965 Referring Provider (SLP): Margette Fast   Encounter Date: 11/12/2020   End of Session - 11/12/20 1243    Visit Number 12    Number of Visits 17    Date for SLP Re-Evaluation 12/04/20    SLP Start Time 1242   pt arrived 12 mins late   SLP Stop Time  35    SLP Time Calculation (min) 33 min    Activity Tolerance Patient tolerated treatment well           Past Medical History:  Diagnosis Date  . Allergy   . Anemia    past hx   . Anxiety   . Arthritis   . Cancer ([place])    skin cancer  . Chronic kidney disease   . Depression   . Diabetes mellitus without complication ([place])   . GERD (gastroesophageal reflux disease)   . Hx of constipation   . Hyperlipidemia   . Hypertension   . Memory difficulty 11/06/2020  . Neuromuscular disorder (HCC)    HH, CTS, neuropathy feet and hands   . Osteoporosis   . Paget disease of bone   . Sleep apnea 11/06/2020  . Thyroid disease     Past Surgical History:  Procedure Laterality Date  . CESAREAN SECTION  10/1990  . CHOLECYSTECTOMY  10/2000  . COLONOSCOPY    . NASAL SEPTUM SURGERY     07/2008 or 07/2010- pt unsure   . TOTAL ABDOMINAL HYSTERECTOMY  10/2004  . UPPER GASTROINTESTINAL ENDOSCOPY      There were no vitals filed for this visit.   Subjective Assessment - 11/12/20 1259    Subjective Pt arrived 12 mins late to ST session    Currently in Pain? No/denies                 ADULT SLP TREATMENT - 11/12/20 1257      General Information   Behavior/Cognition Pleasant mood;Cooperative;Lethargic;Requires cueing      Treatment Provided   Treatment provided Cognitive-Linquistic      Cognitive-Linquistic Treatment   Treatment focused on  Cognition;Patient/family/caregiver education    Skilled Treatment Pt arrived 12 minutes late due to need to take out trash. SLP conducted time management analysis, which revealed multiple opportunities to have completed tasks at earlier, more appropriate time. Pt endorsed she arrived to work on time for last 3/3 opportunities. Pt completed recommended HWK to begin to establish execise into routine. SLP recommended storing items in consistent placement to aid recall and putting reminder in phone to cue self if she forgets.      Assessment / Recommendations / Plan   Plan Continue with current plan of care      Progression Toward Goals   Progression toward goals Not progressing toward goals (comment)   inconsistent carryover           SLP Education - 11/12/20 1409    Education Details external aids, time mangement analysis    Person(s) Educated Patient    Methods Explanation;Demonstration;Verbal cues    Comprehension Verbalized understanding;Returned demonstration            SLP Short Term Goals - 10/08/20 1229      SLP SHORT TERM GOAL #1   Title Pt will use memory compensations for appointments,  medicine management, and other daily activities with min A between or in 3 sessions.    Baseline 09-12-20, 09-24-20    Time --   or 9 total visits   Status Partially Met      SLP SHORT TERM GOAL #2   Title Pt will use external memory aids to arrive to work/therapy on time for 5 opportunities    Baseline 09-12-20, 10-08-20    Status Partially Met      SLP SHORT TERM GOAL #3   Title Pt will use energy conversation strategies to reduce fatigue to complete daily tasks related to work and home with min A over 3 sessions    Baseline 10-08-20    Status Partially Met            SLP Long Term Goals - 11/12/20 1244      SLP LONG TERM GOAL #1   Title Pt will use attention/memory compensations for appointments, medicine management, and other daily activities with rare min A for 3 sessions.     Baseline 09-24-20, 11-06-20 (alarms), 11-12-20    Time --    Period --   or 17 total visits   Status Achieved      SLP LONG TERM GOAL #2   Title Pt will report 2 or less episodes of arriving late to work/therapy/appointments with use of external memory aids with rare min A for 5 opportunities    Baseline 11-06-20, 11-12-20    Time 1    Period Weeks    Status On-going      SLP LONG TERM GOAL #3   Title Pt will use energy conversation strategies to reduce fatigue to complete daily tasks related to work and home with rare min A over 3 sessions    Time 1    Period Weeks    Status On-going            Plan - 11/12/20 1410    Clinical Impression Statement Nancy Harris presents with 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 Pt arrived 12 mins late to [place] session. Slighlty improved consistency and carryover of external memory aids reported. Pt arrived to work/appt on time for last 3 opportunities. SLP reviewed techniques and multiple visual memory aids to implement, including time management analysis. Inconsistent performance exhibited thus far, in which pt may be appropriate for ST DC next session due to limited progress. See "skilled treatment" for further details.    Speech Therapy Frequency 1x /week   pt requested decrease from 2x to 1x/week due to high co pay   Duration 8 weeks   or 17 total visits   Treatment/Interventions Compensatory strategies;Patient/family education;Functional tasks;Cueing hierarchy;Cognitive reorganization;Compensatory techniques;Internal/external aids;SLP instruction and feedback    Potential to Achieve Goals Fair    Potential Considerations Previous level of function;Ability to learn/carryover information;Financial resources;Cooperation/participation level    SLP Home Exercise Plan provided    Consulted and Agree with Plan of Care Patient           Patient will benefit from skilled therapeutic intervention in order to improve the  following deficits and impairments:   Cognitive communication deficit    Problem List Patient Active Problem List   Diagnosis Date Noted  . Memory difficulty 11/06/2020  . Sleep apnea 11/06/2020  . Excessive daytime sleepiness 09/03/2020  . Fatigue due to depression 09/03/2020  . Polypharmacy 09/03/2020  . Chronic insomnia 09/03/2020    Alinda Deem, MA CCC-SLP 11/12/2020, 2:12 PM  [facility]  [facility] [address] [place], [place], [address] Phone: [phone]   Fax:  [phone]   Name: Nancy Harris MRN: [id number] Date of Birth: [birth year]

## 2020-11-19 ENCOUNTER — Ambulatory Visit

## 2020-11-19 ENCOUNTER — Other Ambulatory Visit: Payer: Self-pay

## 2020-11-19 DIAGNOSIS — R41841 Cognitive communication deficit: Secondary | ICD-10-CM | POA: Diagnosis not present

## 2020-11-19 NOTE — Therapy (Signed)
[facility] [address] [place], [place], [address] Phone: [phone]   Fax:  [phone]  Speech Language Pathology Treatment- Discharge Summary  Patient Details  Name: Nancy Harris MRN: [id number] Date of Birth: 1965-10-20 Referring Provider (SLP): Margette Fast   Encounter Date: 11/19/2020   End of Session - 11/19/20 1356    Visit Number 13    Number of Visits 17    Date for SLP Re-Evaluation 12/04/20    SLP Start Time 70    SLP Stop Time  0962    SLP Time Calculation (min) 38 min    Activity Tolerance Patient tolerated treatment well           Past Medical History:  Diagnosis Date  . Allergy   . Anemia    past hx   . Anxiety   . Arthritis   . Cancer ([place])    skin cancer  . Chronic kidney disease   . Depression   . Diabetes mellitus without complication ([place])   . GERD (gastroesophageal reflux disease)   . Hx of constipation   . Hyperlipidemia   . Hypertension   . Memory difficulty 11/06/2020  . Neuromuscular disorder ([facility])    HH, CTS, neuropathy feet and hands   . Osteoporosis   . Paget disease of bone   . Sleep apnea 11/06/2020  . Thyroid disease     Past Surgical History:  Procedure Laterality Date  . CESAREAN SECTION  10/1990  . CHOLECYSTECTOMY  10/2000  . COLONOSCOPY    . NASAL SEPTUM SURGERY     07/2008 or 07/2010- pt unsure   . TOTAL ABDOMINAL HYSTERECTOMY  10/2004  . UPPER GASTROINTESTINAL ENDOSCOPY      There were no vitals filed for this visit.   Subjective Assessment - 11/19/20 1325    Subjective Pt arrived 10 mins late    Currently in Pain? Yes    Pain Score 3     Pain Location Head          SPEECH THERAPY DISCHARGE SUMMARY  Visits from Start of Care: 13  Current functional level related to goals / functional outcomes: Nancy Harris is discharging from skilled ST intervention targeting cognitive communication skills as pt exhibits maximum potential at this time.  Limited consistent progress made thus far targeting functional memory and attention skills related to time management and task completion. Overall adequate recall and attention demonstrated; however, reduced initiation and fluctuations in motivation significantly impacted performance. Inconsistent implementation and carryover of educated techniques and external aids reported at home, which has impacted consistency related to arriving to work/therapy on time and completing important tasks. Impact of psychological factors and sleep questioned as ST intervention progressed. Pt is scheduled for neuropsych evaluation in next few months. Pt verbalized understanding and agreement with ST discharge at this time. Pt aware of need for additional script to return to ST services.   Remaining deficits: Chronic poor time management, inconsistent carryover of ST recommendations, depression   Education / Equipment: Memory and attention compensations, functional external aids, functional application    Plan: Patient agrees to discharge.  Patient goals were partially met. Patient is being discharged due to lack of progress.  ?????           ADULT SLP TREATMENT - 11/19/20 1329      General Information   Behavior/Cognition Pleasant mood;Cooperative;Lethargic;Requires cueing      Treatment Provided   Treatment provided Cognitive-Linquistic      Cognitive-Linquistic Treatment  Treatment focused on Cognition;Patient/family/caregiver education    Skilled Treatment Pt arrived 10 mins late to [place] session. Pt forgot ST folder with external aids. Pt stated "I lose track of time doing stuff." Pt reported she arrived on time to work 3/3 times last week; however, pt has arrived late to majority of ST sessions. SLP again completed time management analysis, in which pt was reportedly only occupied for 2 out of 6 hours this morning. SLP guided patient in deduction/analyzing root problem, in which pt stated "I'm staying in  bed longer. I don't look forward to the day. Life sucks right now." Pt has hx of depression and insomnia, which appears to exacerbated by recent loss of her husband. Pt aware need to use "planning" aids but limited "motivation" and reduced carryover of compensations indicated seemingly related to psych involvement. Given limited progress in ST sessions thus far and need to address underlying psych components, SLP recommends ST discharge at this time. Pt verbalized understanding and agreement.      Assessment / Recommendations / Plan   Plan Discharge SLP treatment due to (comment)   limited carryover     Progression Toward Goals   Progression toward goals Goals partially met, education completed, patient discharged from [facility] - 11/19/20 1358    Education Details psych involvement, discharge summary    Person(s) Educated Patient    Methods Explanation;Demonstration    Comprehension Verbalized understanding;Returned demonstration            SLP Short Term Goals - 10/08/20 1229      SLP SHORT TERM GOAL #1   Title Pt will use memory compensations for appointments, medicine management, and other daily activities with min A between or in 3 sessions.    Baseline 09-12-20, 09-24-20    Time --   or 9 total visits   Status Partially Met      SLP SHORT TERM GOAL #2   Title Pt will use external memory aids to arrive to work/therapy on time for 5 opportunities    Baseline 09-12-20, 10-08-20    Status Partially Met      SLP SHORT TERM GOAL #3   Title Pt will use energy conversation strategies to reduce fatigue to complete daily tasks related to work and home with min A over 3 sessions    Baseline 10-08-20    Status Partially Met            SLP Long Term Goals - 11/19/20 1355      SLP LONG TERM GOAL #1   Title Pt will use attention/memory compensations for appointments, medicine management, and other daily activities with rare min A for 3 sessions.    Baseline  09-24-20, 11-06-20 (alarms), 11-12-20    Period --   or 17 total visits   Status Achieved      SLP LONG TERM GOAL #2   Title Pt will report 2 or less episodes of arriving late to work/therapy/appointments with use of external memory aids with rare min A for 5 opportunities    Baseline 11-06-20, 11-12-20    Status Partially Met      SLP LONG TERM GOAL #3   Title Pt will use energy conversation strategies to reduce fatigue to complete daily tasks related to work and home with rare min A over 3 sessions    Status Partially Met            Plan -  11/19/20 1407    Clinical Impression Statement Nancy Harris presents with persistent mild cognitive linguistic deficits, which seem to be exacerbated by underlying psychological factors, particularly depression. SLP has provided extensive education and instruction of compensations to improve time management and task completion; howevever, limited motivation and initiation has impacted performance and progress. SLP recommmends pt f/u with psychologist and address psych issues impacting her ability to cognitively function at her highest. Pt verbalized understanding and agreement with ST discharge at time due to plateau in progress. Pt aware of need for additional script for ST services if she needs additional ST services in future.    Treatment/Interventions Compensatory strategies;Patient/family education;Functional tasks;Cueing hierarchy;Cognitive reorganization;Compensatory techniques;Internal/external aids;SLP instruction and feedback    Potential to Achieve Goals Fair    Potential Considerations Previous level of function;Ability to learn/carryover information;Financial resources;Cooperation/participation level    SLP Home Exercise Plan provided    Consulted and Agree with Plan of Care Patient           Patient will benefit from skilled therapeutic intervention in order to improve the following deficits and impairments:   Cognitive communication  deficit    Problem List Patient Active Problem List   Diagnosis Date Noted  . Memory difficulty 11/06/2020  . Sleep apnea 11/06/2020  . Excessive daytime sleepiness 09/03/2020  . Fatigue due to depression 09/03/2020  . Polypharmacy 09/03/2020  . Chronic insomnia 09/03/2020    Alinda Deem, MA CCC-SLP 11/19/2020, 2:10 PM  [facility] [address] [place], [place], [address] Phone: [phone]   Fax:  [phone]   Name: Nancy Harris MRN: [id number] Date of Birth: 1966-05-01

## 2020-11-20 ENCOUNTER — Ambulatory Visit
Admission: RE | Admit: 2020-11-20 | Discharge: 2020-11-20 | Disposition: A | Discharge status: Home / Self Care | Payer: PRIVATE HEALTH INSURANCE | Source: Ambulatory Visit | Attending: Family Medicine | Admitting: Family Medicine

## 2020-11-20 ENCOUNTER — Ambulatory Visit

## 2020-11-20 DIAGNOSIS — R928 Other abnormal and inconclusive findings on diagnostic imaging of breast: Secondary | ICD-10-CM

## 2020-11-27 ENCOUNTER — Telehealth: Payer: Self-pay | Admitting: Neurology

## 2020-11-27 DIAGNOSIS — G4733 Obstructive sleep apnea (adult) (pediatric): Secondary | ICD-10-CM

## 2020-11-27 DIAGNOSIS — Z79899 Other long term (current) drug therapy: Secondary | ICD-10-CM

## 2020-11-27 DIAGNOSIS — F5104 Psychophysiologic insomnia: Secondary | ICD-10-CM

## 2020-11-27 DIAGNOSIS — G4719 Other hypersomnia: Secondary | ICD-10-CM

## 2020-11-27 NOTE — Telephone Encounter (Signed)
Nancy Harris, please contact this patient - regarding RESULT of Sleep study :   DIAGNOSIS 1.        Obstructive Sleep Apnea responded well to 9 cm water  2.        Periodic Limb Movement Disorder persisted and increased under CPAP and persisted into REM sleep. REM behavior disorder will need to be considered.    PLANS/RECOMMENDATIONS: The patient will be set up with an auto-titration CPAP from 6-12 cm water pressure, 2 cm EPR, heated humidification and mask of choice, the patient did well under a medium size N20 ResMed nasal mask, even in supine position and in REM.   1.        The patient should avoid evening sedatives, hypnotics, and alcohol beverage consumption 2.        CPAP therapy compliance is defined as 4 hours or more of nightly use. 3.         REM behavior disorder to be discussed.   DISCUSSION: A follow up appointment for CPAP will be scheduled in the Sleep Clinic at [facility]. Dr. Jannifer Franklin remains primary neurologist.   Please call [phone] with any questions.

## 2020-11-27 NOTE — Telephone Encounter (Signed)
I called pt. I advised pt that Dr. Brett Fairy reviewed their sleep study results and found that pt was best treated at pressure of 9 cm water pressure  An order was already sent to [facility]) early may. She will call them about getting scheduled. initial CPAP apt is scheduled 03/25/21 at 8:30 am with Dr Brett Fairy

## 2020-12-10 ENCOUNTER — Telehealth: Payer: Self-pay | Admitting: Neurology

## 2020-12-10 DIAGNOSIS — G4719 Other hypersomnia: Secondary | ICD-10-CM

## 2020-12-10 DIAGNOSIS — G4733 Obstructive sleep apnea (adult) (pediatric): Secondary | ICD-10-CM

## 2020-12-10 DIAGNOSIS — Z79899 Other long term (current) drug therapy: Secondary | ICD-10-CM

## 2020-12-10 DIAGNOSIS — F5104 Psychophysiologic insomnia: Secondary | ICD-10-CM

## 2020-12-10 NOTE — Telephone Encounter (Signed)
The patient may have a prolonged wait ahead of her , as CPAPs are on back-order. Supply chain problems affect this medical device as well. CD

## 2020-12-12 NOTE — Telephone Encounter (Signed)
Received an email from [facility]) letting us know that they have attempted to reach the patient 3 times. 6/6,6/8 and 6/13. Pt has not returned their calls. They will void the order and will wait to hear from the patient.

## 2021-01-10 ENCOUNTER — Ambulatory Visit: Payer: PRIVATE HEALTH INSURANCE | Admitting: Psychology

## 2021-01-10 ENCOUNTER — Other Ambulatory Visit: Payer: Self-pay

## 2021-01-10 ENCOUNTER — Encounter: Payer: Self-pay | Admitting: Psychology

## 2021-01-10 ENCOUNTER — Ambulatory Visit (INDEPENDENT_AMBULATORY_CARE_PROVIDER_SITE_OTHER): Admitting: Psychology

## 2021-01-10 DIAGNOSIS — K219 Gastro-esophageal reflux disease without esophagitis: Secondary | ICD-10-CM | POA: Insufficient documentation

## 2021-01-10 DIAGNOSIS — G709 Myoneural disorder, unspecified: Secondary | ICD-10-CM | POA: Insufficient documentation

## 2021-01-10 DIAGNOSIS — I10 Essential (primary) hypertension: Secondary | ICD-10-CM | POA: Insufficient documentation

## 2021-01-10 DIAGNOSIS — R4189 Other symptoms and signs involving cognitive functions and awareness: Secondary | ICD-10-CM

## 2021-01-10 DIAGNOSIS — N189 Chronic kidney disease, unspecified: Secondary | ICD-10-CM | POA: Insufficient documentation

## 2021-01-10 DIAGNOSIS — F411 Generalized anxiety disorder: Secondary | ICD-10-CM

## 2021-01-10 DIAGNOSIS — E119 Type 2 diabetes mellitus without complications: Secondary | ICD-10-CM | POA: Insufficient documentation

## 2021-01-10 DIAGNOSIS — F332 Major depressive disorder, recurrent severe without psychotic features: Secondary | ICD-10-CM | POA: Diagnosis not present

## 2021-01-10 DIAGNOSIS — E079 Disorder of thyroid, unspecified: Secondary | ICD-10-CM | POA: Insufficient documentation

## 2021-01-10 DIAGNOSIS — E785 Hyperlipidemia, unspecified: Secondary | ICD-10-CM | POA: Insufficient documentation

## 2021-01-10 NOTE — Progress Notes (Signed)
   Psychometrician Note   Cognitive testing was administered to Nancy Harris by Milana Kidney, B.S. (psychometrist) under the supervision of Dr. Christia Reading, Ph.D., licensed psychologist on 01/10/21. Ms. Lewallen did not appear overtly distressed by the testing session per behavioral observation or responses across self-report questionnaires. Rest breaks were offered.    The battery of tests administered was selected by Dr. Christia Reading, Ph.D. with consideration to Ms. Tipps's current level of functioning, the nature of her symptoms, emotional and behavioral responses during interview, level of literacy, observed level of motivation/effort, and the nature of the referral question. This battery was communicated to the psychometrist. Communication between Dr. Christia Reading, Ph.D. and the psychometrist was ongoing throughout the evaluation and Dr. Christia Reading, Ph.D. was immediately accessible at all times. Dr. Christia Reading, Ph.D. provided supervision to the psychometrist on the date of this service to the extent necessary to assure the quality of all services provided.    TYESE FINKEN will return within approximately 1-2 weeks for an interactive feedback session with Dr. Melvyn Novas at which time her test performances, clinical impressions, and treatment recommendations will be reviewed in detail. Ms. Manasco understands she can contact our office should she require our assistance before this time.  A total of 170 minutes of billable time were spent face-to-face with Ms. Dildine by the psychometrist. This includes both test administration and scoring time. Billing for these services is reflected in the clinical report generated by Dr. Christia Reading, Ph.D.  This note reflects time spent with the psychometrician and does not include test scores or any clinical interpretations made by Dr. Melvyn Novas. The full report will follow in a separate note.

## 2021-01-10 NOTE — Progress Notes (Signed)
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 Test: Raw Score (Scaled Score) Percentile     Color Naming 37 secs. (7) 16 Below Average    Word Reading 25 secs. (9) 37 Average    Inhibition 67 secs. (9) 37 Average      Total Errors 5 errors (5) 5 Well Below Average     Inhibition/Switching 65 secs. (11) 63 Average      Total Errors 1 error (11) 63 Average       D-KEFS Verbal Fluency Test: Raw Score (Scaled Score) Percentile     Letter Total Correct 36 (10) 50 Average    Category Total Correct 29 (6) 9 Below Average    Category Switching Total Correct 12 (9) 37 Average    Category Switching Accuracy 11 (9) 37 Average      Total Set Loss Errors 0 (13) 84 Above Average      Total Repetition Errors 0 (13) 84 Above Average       Language:     NAB Language Module, Form 1: T Score Percentile     Naming 29/31 (43) 25 Average       Visuospatial/Visuoconstruction:       Raw Score Percentile   Clock Drawing: 10/10 --- Within Normal Limits       NAB Spatial Module, Form 1: T Score Percentile     Figure Drawing Copy 52 58 Average    Figure Drawing Immediate Recall 48 42 Average        Scaled Score Percentile   WAIS-IV Block Design: 8 25 Average       Mood and Personality:  Raw Score Percentile   Beck Depression Inventory - II: 46 --- Severe  PROMIS Anxiety Questionnaire: 28 --- Severe       Personality Assessment Inventory: T Score  Percentile     Inconsistency 52 --- Within Normal Limits    Infrequency 40 --- Within Normal Limits    Negative Impression 73 --- Moderate    Positive Impression 27 --- Within Normal Limits    Somatic Complaints 81 --- Elevated    Anxiety 71 --- Elevated    Anxiety-Related Disorders 83 --- Elevated    Depression 98 --- Elevated    Mania 50 --- Within Normal Limits    Paranoia 54 --- Within Normal Limits    Schizophrenia 80 --- Elevated    Borderline Features 72 --- Elevated    Antisocial Features 44 --- Within Normal Limits    Alcohol Problems 41 --- Within Normal Limits    Drug Problems 42 --- Within Normal Limits    Aggression 57 --- Within Normal Limits    Suicidal Ideation 74 --- Elevated    Stress 71 --- Elevated    Non Support 64 --- Within Normal Limits    Treatment Rejection 25 --- Within Normal Limits    Dominance 22 --- Within Normal Limits    Warmth 42 --- Within Normal Limits       Additional Questionnaires:      Raw Score Percentile   PROMIS Sleep Disturbance Questionnaire: 39 --- Severe   Informed Consent and Coding/Compliance:   The current evaluation represents a clinical evaluation for the purposes previously outlined by the referral source and is in no way reflective of a forensic evaluation.   Ms. Kawabata was provided with a verbal description of the nature and purpose of the present neuropsychological evaluation. Also reviewed were the foreseeable risks and/or discomforts and  benefits of the procedure, limits of confidentiality, and mandatory reporting requirements of this provider. The patient was given the opportunity to ask questions and receive answers about the evaluation. Oral consent to participate was provided by the patient.   This evaluation was conducted by Christia Reading, Ph.D., licensed clinical neuropsychologist. Ms. Beavers completed a clinical interview with Dr. Melvyn Novas, billed as one unit [phone], and 170 minutes of cognitive testing and scoring, billed as one unit [phone] and five additional units [id number]. Psychometrist Milana Kidney, B.S., assisted Dr. Melvyn Novas with test administration and scoring procedures. As a separate and discrete service, Dr. Melvyn Novas spent a total of 160 minutes in interpretation and report writing billed as one unit [phone] and two units [id number].

## 2021-01-22 ENCOUNTER — Ambulatory Visit (INDEPENDENT_AMBULATORY_CARE_PROVIDER_SITE_OTHER): Admitting: Psychology

## 2021-01-22 ENCOUNTER — Other Ambulatory Visit: Payer: Self-pay

## 2021-01-22 DIAGNOSIS — F332 Major depressive disorder, recurrent severe without psychotic features: Secondary | ICD-10-CM

## 2021-01-22 DIAGNOSIS — F411 Generalized anxiety disorder: Secondary | ICD-10-CM

## 2021-01-22 DIAGNOSIS — R4189 Other symptoms and signs involving cognitive functions and awareness: Secondary | ICD-10-CM | POA: Diagnosis not present

## 2021-01-22 NOTE — Progress Notes (Signed)
   Neuropsychology Feedback Session Nancy Harris. [place] [facility] Neu[facility]  Reason for Referral:   Nancy Harris is a 56 y.o. right-handed Caucasian female referred by  Margette Fast, M.D. , to characterize her current cognitive functioning and assist with diagnostic clarity and treatment planning in the context of subjective cognitive decline and psychiatric comorbidities.  Feedback:   Nancy Harris completed a comprehensive neuropsychological evaluation on 01/10/2021. Please refer to that encounter for the full report and recommendations. Briefly, results suggested neuropsychological functioning largely within normal limits relative to age-matched peers. Across mood-related questionnaires, Nancy Harris reported acute symptoms of severe anxiety and severe depression. Across a more comprehensive personality assessment, she elevated numerous clinical subscales. Responses suggest an individual who is severely depressed, discouraged, and withdrawn. She most likely is plagued by thoughts of worthlessness, hopelessness, and personal failure. She openly admitted to feelings of sadness, loss of interest in normal activities, and loss of pleasure in previously pleasurable activities. Responses also suggest significant symptoms related to traumatic stress and that she may have experienced a disturbing traumatic event in the past which continues to distress her presently. She reported recurring suicidal ideation, with concerns being heightened by situational stress, lack of social support, and social isolation. However, during interview, she denied any plan or intent to act upon these thoughts. Overall, her psychological profile is associated with marked distress with the likelihood for significant impairment in day-to-day life.  Nancy Harris was accompanied by her daughter during the current feedback session. Content of the current session focused on the results of her  neuropsychological evaluation. Nancy Harris and her daughter were given the opportunity to ask questions and their questions were answered. They were encouraged to reach out should additional questions arise. A copy of her report was provided at the conclusion of the visit.      25 minutes were spent conducting the current feedback session with Nancy Harris, billed as one unit [phone].

## 2021-02-19 ENCOUNTER — Ambulatory Visit (INDEPENDENT_AMBULATORY_CARE_PROVIDER_SITE_OTHER): Admitting: Psychologist

## 2021-02-19 DIAGNOSIS — Z634 Disappearance and death of family member: Secondary | ICD-10-CM | POA: Diagnosis not present

## 2021-02-19 DIAGNOSIS — F411 Generalized anxiety disorder: Secondary | ICD-10-CM | POA: Diagnosis not present

## 2021-02-19 DIAGNOSIS — F331 Major depressive disorder, recurrent, moderate: Secondary | ICD-10-CM | POA: Diagnosis not present

## 2021-02-25 ENCOUNTER — Other Ambulatory Visit (HOSPITAL_COMMUNITY): Payer: Self-pay | Admitting: *Deleted

## 2021-02-26 ENCOUNTER — Other Ambulatory Visit: Payer: Self-pay

## 2021-02-26 ENCOUNTER — Ambulatory Visit (HOSPITAL_COMMUNITY)
Admission: RE | Admit: 2021-02-26 | Discharge: 2021-02-26 | Disposition: A | Discharge status: Home / Self Care | Source: Ambulatory Visit | Attending: Sports Medicine | Admitting: Sports Medicine

## 2021-02-26 ENCOUNTER — Ambulatory Visit (INDEPENDENT_AMBULATORY_CARE_PROVIDER_SITE_OTHER): Admitting: Psychologist

## 2021-02-26 DIAGNOSIS — F411 Generalized anxiety disorder: Secondary | ICD-10-CM | POA: Diagnosis not present

## 2021-02-26 DIAGNOSIS — F331 Major depressive disorder, recurrent, moderate: Secondary | ICD-10-CM

## 2021-02-26 DIAGNOSIS — Z634 Disappearance and death of family member: Secondary | ICD-10-CM | POA: Diagnosis not present

## 2021-02-26 DIAGNOSIS — M810 Age-related osteoporosis without current pathological fracture: Secondary | ICD-10-CM | POA: Insufficient documentation

## 2021-02-26 MED ORDER — ZOLEDRONIC ACID 5 MG/100ML IV SOLN
5.0000 mg | Freq: Once | INTRAVENOUS | Status: AC
  Administered 2021-02-26: 5 mg via INTRAVENOUS

## 2021-02-26 MED ORDER — ZOLEDRONIC ACID 5 MG/100ML IV SOLN
INTRAVENOUS | Status: AC
  Filled 2021-02-26: qty 100

## 2021-03-18 ENCOUNTER — Ambulatory Visit: Admitting: Neurology

## 2021-03-19 ENCOUNTER — Ambulatory Visit (INDEPENDENT_AMBULATORY_CARE_PROVIDER_SITE_OTHER): Admitting: Psychologist

## 2021-03-19 DIAGNOSIS — Z634 Disappearance and death of family member: Secondary | ICD-10-CM | POA: Diagnosis not present

## 2021-03-19 DIAGNOSIS — F411 Generalized anxiety disorder: Secondary | ICD-10-CM

## 2021-03-19 DIAGNOSIS — F331 Major depressive disorder, recurrent, moderate: Secondary | ICD-10-CM

## 2021-03-25 ENCOUNTER — Ambulatory Visit: Payer: Self-pay | Admitting: Neurology

## 2021-03-26 ENCOUNTER — Ambulatory Visit (HOSPITAL_COMMUNITY): Payer: PRIVATE HEALTH INSURANCE

## 2021-03-26 ENCOUNTER — Ambulatory Visit (HOSPITAL_COMMUNITY): Payer: Self-pay

## 2021-04-22 ENCOUNTER — Encounter: Payer: Self-pay | Admitting: Neurology

## 2021-04-23 ENCOUNTER — Ambulatory Visit (INDEPENDENT_AMBULATORY_CARE_PROVIDER_SITE_OTHER): Admitting: Neurology

## 2021-04-23 ENCOUNTER — Encounter: Payer: Self-pay | Admitting: Neurology

## 2021-04-23 VITALS — BP 127/81 | HR 97 | Ht 60.0 in | Wt 184.5 lb

## 2021-04-23 DIAGNOSIS — Z72821 Inadequate sleep hygiene: Secondary | ICD-10-CM | POA: Diagnosis not present

## 2021-04-23 DIAGNOSIS — E349 Endocrine disorder, unspecified: Secondary | ICD-10-CM | POA: Insufficient documentation

## 2021-04-23 DIAGNOSIS — G63 Polyneuropathy in diseases classified elsewhere: Secondary | ICD-10-CM

## 2021-04-23 DIAGNOSIS — F5111 Primary hypersomnia: Secondary | ICD-10-CM | POA: Insufficient documentation

## 2021-04-23 DIAGNOSIS — G4733 Obstructive sleep apnea (adult) (pediatric): Secondary | ICD-10-CM | POA: Diagnosis not present

## 2021-04-23 DIAGNOSIS — F411 Generalized anxiety disorder: Secondary | ICD-10-CM | POA: Diagnosis not present

## 2021-04-23 DIAGNOSIS — Z9989 Dependence on other enabling machines and devices: Secondary | ICD-10-CM

## 2021-04-23 NOTE — Patient Instructions (Signed)
  1) persistent hypersomnia under well controlled OSA, has been compliant with CPAP treatment.  This is NOT apnea related hypersomnia.   2) Idiopathic hypersomnia or medication induced? Bipolar depression, grief can contribute. Has been unable to feel refreshed , more groggy. Uses clonazepam in AM , due to anxiety. She oversleeps.   3) insomnia; trazodone allowed at 150 mg, but she feels she is more sleepy after using.   4) RLS : onset in bed- she should take Requip before she goes to bed, by about 30-60 minutes. Bedtime is currently not set, no routines.  Bedtime can currently be from Midnight - 4 AM.  That's not a routine. Set a rise time and set your bedtime 8 hours earlier.   5) avoid Naps in daytime- 15-40  minutes maximal duration, set a timer. No hour long naps.    My Plan is to proceed with:  1)This non organic Hypersomnia  needs to be addressed with psychiatry. She reports being always tardy, not having an inner clock, poor time management, gets hyper-focussed , and yet distracted easily.  Vyvanse is now started and managed by Noemi Chapel.  In short, Nancy Harris is presenting with chronic insomnia , a symptom that can be attributed to Bipolar disorder, anxiety and grief.

## 2021-04-23 NOTE — Progress Notes (Signed)
CM sent to [facility] 

## 2021-04-23 NOTE — Progress Notes (Signed)
SLEEP MEDICINE CLINIC    Provider:  Larey Seat, MD  Primary Care Physician:  Marco Collie, MD No address on file     Referring Provider:  Lenor Coffin, MD , Butler Denmark, NP          Chief Complaint according to patient   Patient presents with:     New Patient (Initial Visit)     Patient of Dr Jannifer Franklin with headaches and persitent hypersomnia, not influenced by CPAP treated OSA. Has RLS.       HISTORY OF PRESENT ILLNESS:  Nancy Harris is a 55 y.o. Caucasian female patient seen here  officially for fatigue, but she reports her problem is chronic insomnia- she relies on sleep medication for many years, is treated by psychiatry- but can't get up in time in AM and lost her last 2 jobs.   Sleep Clinic Re-VISIT on 04/23/2021 : Check he had been referred by Butler Denmark and Gari Crown, MD for an evaluation of excessive daytime sleepiness but she really had suffered from chronic insomnia for many years.  She has been treated with sleep aids through her psychiatrist for a while.  I had ordered a home sleep test to screen for sleep apnea but because the patient had also told me that she is a loud snorer.  Some of her symptoms of insomnia were certainly exacerbated after she lost her husband.  Her life had completely changed and she had to return to work outside the home which she had not done for over a decade.  She reports that she lost some of these jobs that she had because of inability to get up in the morning and to be on time.  Her sleep study was actually followed by a CPAP titration in lab on 4-7 2022.   Her AHI had been 23.8/h and she had loud snoring.  She did have frequent limb movements but these all occurred.  And 1 cluster between 1 and 1:30 AM and I have not found out why.  The Epworth Sleepiness Scale had at the time being obtained at 18 out of 24 points of fatigue severity score at 63 points.  BMI was 35.  The patient was titrated with a nasal mask  ResMed N 20 and medium size beginning at 5 cmH2O to 9 cm water.  At the final pressure her AHI became 0.  She was set up with an auto titration CPAP between 6 and 12 cm water pressure, 2 cm expiratory pressure relief, heated humidification and did apparently well with a nasal mask.  Even in supine position and during REM sleep her apnea was controlled.    Looking at her compliance data.  She has used the machine 25 out of 30 days but she has on some days followed short of the 4-hour mark.    Average user time is 5 hours and 36 minutes.  The machine is set between 6 and 12 cmH2O was 2 cm EPR her residual AHI is 1.6/h which is excellent.  The 95th percentile air leak is 14.2 L a minute this is a mild to moderate air leak not severe.  Pressure need at the 95th percentile is 10.3 cm water pressure.  I would be happy to increase her maximum pressure by 1 cm to further restrict obstructive apneas.  She did not develop central apneas or Cheyne-Stokes respirations.  So she has been very compliant since the 11th of this month but the  14 days prior to that her use of CPAP was sporadic. She had a viral infection that made use of CPAP impossible.   Her EDS remains 15/ 24 points on her Epworth score.   Persistent hypersomnia is not related to APNEA- it is related to grief, bipolar disorder and she reports more headaches, treated  by Dr Jannifer Franklin.     Chief concern according to patient :  Pt with sister, rm 83. Presents with concerns that have been going on for a while. She struggles with insomnia. States she can avg 5-6 hrs a night. She wakes up earlier than suppose to and is unable to go back to sleep. Feels exhausted during the day. Never had a SS. States she has leg twitches and snores in sleep   I have the pleasure of seeing Nancy Harris on 08-02-2020, a right-handed Caucasian female with a chronic Insomnia sleep disorder.  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  Bipolar Depression History- with anxiety and panic attacks.    Sleep relevant medical history: Nightmares- Night terrors, sleep walking- all in childhood..  Had deviated septum repair, chronic sinus problems, 2010.  Family medical /sleep history: No other family member on CPAP with OSA, insomnia, sleep walkers. So, in  Conclusion, I think her memory disturbance may not be primarily due to bipolar disorder and depression but could be due to the polypharmacy her medications certainly will affect her circadian rhythm as well as her cognitive abilities.  Her chronic headaches may also be exacerbated and felt to be exacerbated due to depression.  Her recent Mini-Mental Status Examination was 29 out of 30.  A MoCA was not attempted.  Given that she endorsed a very high level of fatigue and a very high level of daytime sleepiness in concordance with chronic insomnia I will order a sleep study to screen her for sleep apnea.  We can do the sleep study at home or in the lab.  She will be referred to neuropsychological testing by Dr. Jannifer Franklin.  He ordered also an MRI of the brain.    Social history:  Patient is waking as a Scientist, water quality at [employer] in [employer] and lives in a household alone, no pates. Husband passed in May 2021, he used to work third shift-   Family status is widowed.  The patient currently working day time hours.  Tobacco use: none    ETOH use ; none , Caffeine intake in form of Coffee(  1 cup a week)   Sleep habits are as follows: The patient's dinner time is between 7-10 PM.  The patient goes to bed at 1 AM - falling asleep by 1.30 AM-  on medication and continues to sleep for 5-7 hours, does not wake for bathroom breaks. The preferred sleep position is on her back and rarely on hersides, with the support of 1-2 pillows. Dreams are reportedly frequent/vivid.  8-9 AM is the usual rise time. The patient wakes up with an alarm.  Has difficulties to get up-  She reports not feeling refreshed or restored in AM, with symptoms such as dry mouth, morning headaches, and residual fatigue.  Naps are  taken infrequently, in front of the TV- lasting from 30-45 minutes.   Review of Systems: Out of a complete 14 system review, the patient complains of only the following symptoms, and all other reviewed systems are negative.:  Fatigue, sleepiness , snoring, fragmented sleep, Insomnia.    How likely are you to doze in the following situations: 0 = not likely, 1 = slight chance, 2 = moderate chance, 3 = high chance   Sitting and Reading? Watching Television? Sitting inactive in a public place (theater or meeting)? As a passenger in a car for an hour without a break? Lying down in the afternoon when circumstances permit? Sitting and talking to someone? Sitting quietly after lunch without alcohol? In a car, while stopped for a few minutes in traffic?   Total = 18/ 24 points   FSS endorsed at 61 / 63 points.   Social History   Socioeconomic History   Marital status: Widowed    Spouse name: Not on file   Number of children: Not on file   Years of education: 12   Highest education level: High school graduate  Occupational History   Occupation: Scientist, water quality    Comment: Part time  Tobacco Use   Smoking status: Never   Smokeless tobacco: Never  Substance and Sexual Activity   Alcohol use: Not Currently   Drug use: Never   Sexual activity: Not on file  Other Topics Concern   Not on  file  Social History Narrative   Lives alone   Right Handed   Drinks caffeine occassionally   Social Determinants of Health   Financial Resource Strain: Not on file  Food Insecurity: Not on file  Transportation Needs: Not on file  Physical Activity: Not on file  Stress: Not on file  Social Connections: Not on file    Family History  Problem Relation Age of Onset   Alzheimer's disease Mother        early-onset; approximately 77   Alzheimer's disease Maternal Grandfather    Colon cancer Paternal Grandmother    Breast cancer Neg Hx    Colon polyps Neg Hx    Esophageal cancer Neg Hx    Rectal cancer Neg Hx    Stomach cancer Neg Hx     Past Medical History:  Diagnosis Date   Allergy    Anemia    past hx    Arthritis    Chronic insomnia 09/03/2020   Chronic kidney disease    Chronic tension-type headache, not intractable 08/15/2015   Diabetes mellitus without complication    Essential tremor 11/30/2012   Excessive daytime sleepiness 09/03/2020   Generalized anxiety disorder    GERD (gastroesophageal reflux disease)    History of skin cancer    Hyperlipidemia    Hypertension    Major depressive disorder    Neuromuscular disorder    HH, CTS, neuropathy feet and hands    Obstructive sleep apnea 11/06/2020   Paget's disease of bone 11/30/2017   Postmenopausal osteoporosis 11/30/2017   Thyroid disease     Past Surgical History:  Procedure Laterality Date   CESAREAN SECTION  10/1990   CHOLECYSTECTOMY  10/2000   COLONOSCOPY     NASAL SEPTUM SURGERY     07/2008 or 07/2010- pt unsure    TOTAL ABDOMINAL HYSTERECTOMY  10/2004   UPPER GASTROINTESTINAL ENDOSCOPY       Current Outpatient Medications on File Prior to Visit  Medication Sig Dispense Refill   aspirin EC 81 MG tablet Take  81 mg by mouth at bedtime.     BIOTIN PO Take 5,000 mcg by mouth at bedtime.     brexpiprazole (REXULTI) 2 MG TABS tablet Take 2 mg by mouth at bedtime.     clonazePAM (KLONOPIN) 1 MG  tablet Take 1 mg by mouth daily after breakfast.     colesevelam (WELCHOL) 625 MG tablet Take 1,875 mg by mouth 2 (two) times daily with a meal.     Cyanocobalamin (VITAMIN B-12 PO) Take 1,000 mcg by mouth every morning.     dextroamphetamine (DEXTROSTAT) 10 MG tablet Take 10 mg by mouth daily.     Dulaglutide (TRULICITY) 3 OZ/3.0QM SOPN Inject 3 mg into the skin once a week.     ezetimibe (ZETIA) 10 MG tablet      Ferrous Sulfate (IRON) 325 (65 Fe) MG TABS Take 1 tablet by mouth at bedtime.     gabapentin (NEURONTIN) 300 MG capsule Take 300 mg by mouth at bedtime.     levothyroxine (SYNTHROID) 125 MCG tablet Take 125 mcg by mouth daily.     metFORMIN (GLUCOPHAGE) 500 MG tablet Take 500 mg by mouth 2 (two) times daily with a meal.     metoprolol succinate (TOPROL-XL) 50 MG 24 hr tablet Take 50 mg by mouth daily after breakfast. Take with or immediately following a meal.     Omega-3 Fatty Acids (OMEGA 3 PO) Take 1,000 mg by mouth daily. After evening meal     pantoprazole (PROTONIX) 40 MG tablet Take 1 tablet (40 mg total) by mouth daily. 30 tablet 11   pioglitazone (ACTOS) 45 MG tablet Take 45 mg by mouth every morning.     rOPINIRole (REQUIP) 1 MG tablet Take 2 mg by mouth every evening. When finished will take the 2mg  tablet     rOPINIRole (REQUIP) 2 MG tablet Take 2 mg by mouth at bedtime.     traZODone (DESYREL) 100 MG tablet Take 125 mg by mouth at bedtime as needed.     venlafaxine XR (EFFEXOR-XR) 150 MG 24 hr capsule Take 300 mg by mouth daily with breakfast.     Vitamin D, Ergocalciferol, (DRISDOL) 1.25 MG (50000 UT) CAPS capsule Take 50,000 Units by mouth as directed. Takes every other week     VYVANSE 70 MG capsule Take 70 mg by mouth daily.     No current facility-administered medications on file prior to visit.    Allergies  Allergen Reactions   Latex Itching    Physical exam:  Today's Vitals   04/23/21 1457  BP: 127/81  Pulse: 97  Weight: 184 lb 8 oz (83.7 kg)   Height: 5' (1.524 m)   Body mass index is 36.03 kg/m.   Wt Readings from Last 3 Encounters:  04/23/21 184 lb 8 oz (83.7 kg)  11/06/20 184 lb 3.2 oz (83.6 kg)  08/02/20 178 lb (80.7 kg)     Ht Readings from Last 3 Encounters:  04/23/21 5' (1.524 m)  11/06/20 5' (1.524 m)  08/02/20 5' (1.524 m)      General: The patient is awake, alert and appears not in acute distress. The patient is well groomed. Head: Normocephalic, atraumatic. Neck is supple. Mallampati 2,  neck circumference: 17inches .  Nasal airflow barely patent.  Retrognathia is not seen.  Short neck.  Dental status: biological teeth.  Cardiovascular:  Regular rate and cardiac rhythm by pulse,  without distended neck veins. Respiratory: Lungs are clear to auscultation.  Skin:  Without evidence of  ankle edema, or rash. Trunk: The patient's posture is relaxed, she is always moving her hands.    Neurologic exam : The patient is awake and alert, oriented to place and time.   Memory subjective described as intact.  Attention span & concentration ability appears normal.  Speech is fluent,  without  dysarthria, dysphonia or aphasia.  Mood and affect are meek,  voice is shaking, she is anxious.   Cranial nerves: no loss of smell or taste reported  Pupils are equal and briskly reactive to light. Funduscopic exam deferred.  Extraocular movements in vertical and horizontal planes were intact and without nystagmus. No Diplopia. Visual fields by finger perimetry are intact. Hearing was intact to soft voice and finger rubbing.    Facial sensation intact to fine touch.  Facial motor strength is symmetric a  Neck ROM : rotation, tilt and flexion extension were normal for age and shoulder shrug was symmetrical.    Motor exam:  Symmetric bulk, tone and ROM.    Elevated  tone with biceps  cog -wheeling, symmetric grip strength.  She has weakness in hip abduction on the left over right , same with flexion but not with ad-duction.     Sensory:  deferred.    Coordination: Rapid alternating movements in the fingers/hands were of reduced  speed.  The Finger-to-nose maneuver was intact without evidence of ataxia, dysmetria or tremor. Handwriting changes were reported,    Gait and station: Patient could rise unassisted from a seated position, walked without assistive device. Left foot inverted. pagets disease with osteolytic lesions, osteoporosis.  Stance is of normal width/ base. Toe and heel walk were deferred.  Deep tendon reflexes: in the  upper and lower extremities are symmetric and intact.  Babinski response was deferred .      After spending a total time of  35 minutes face to face and additional time for physical and neurologic examination, review of laboratory studies,  personal review of imaging studies, reports and results of other testing and review of referral information / records as far as provided in visit, I have established the following assessments:  1) persistent hypersomnia under well controlled OSA, CPAP treatment. This is NOT apnea related hypersomnia.   2)idiopathic hypersomnia or medication induced.? Bipolar depression, grief. Has been unable to feel refreshed , more groggy. Uses clonazepam in AM , due to anxiety.  3) insomnia; trazodone allowed at 150 mg, but she feels she is more sleepy after using.   4) RLS : onset in bed- she should take Requip before she goes to bed, 30-60 minutes. Bedtime is currently not set, no routines.  Bedtime can currently be from Midnight - 4 AM.  That's not a routine.    My Plan is to proceed with:  1)This non organic Hypersomnia  needs to be addressed with psychiatry. She reports being always tardy, not having an inner clock, poor time management, gets hyper-focussed , and yet distracted easily.  Vyvanse is now started and managed by Noemi Chapel.  I adjusted humidity level from 4 to 5.   I reset RAMP time to 30 minutes.   I asked DME to increase pressure  max by 2 cm water from 12 to 14 cm water.   In short, TALIA HOHEISEL is presenting with chronic insomnia , a symptom that can be attributed to Bipolar disorder, anxiety and grief.   RV only for CPAP compliance in 12 month    CC: I will share my notes with  PCP>   Electronically signed by: Larey Seat, MD 04/23/2021 3:16 PM  [facility] and [facility] Sleep Board certified by The [employer] of Sleep Medicine and Diplomate of the [employer] of Sleep Medicine. Board certified In Neurology through the [place], Fellow of the [employer] of Neurology. Medical Director of [employer].

## 2021-04-30 ENCOUNTER — Ambulatory Visit (INDEPENDENT_AMBULATORY_CARE_PROVIDER_SITE_OTHER): Admitting: Psychology

## 2021-04-30 DIAGNOSIS — F3130 Bipolar disorder, current episode depressed, mild or moderate severity, unspecified: Secondary | ICD-10-CM

## 2021-04-30 DIAGNOSIS — F419 Anxiety disorder, unspecified: Secondary | ICD-10-CM

## 2021-04-30 DIAGNOSIS — F4381 Prolonged grief disorder: Secondary | ICD-10-CM

## 2021-05-06 ENCOUNTER — Telehealth: Payer: Self-pay | Admitting: Neurology

## 2021-05-06 NOTE — Telephone Encounter (Signed)
LVM and sent mychart message for patient to call back to reschedule due to Judson Roch still being out on leave.

## 2021-05-16 ENCOUNTER — Ambulatory Visit (INDEPENDENT_AMBULATORY_CARE_PROVIDER_SITE_OTHER): Admitting: Psychology

## 2021-05-16 DIAGNOSIS — F4381 Prolonged grief disorder: Secondary | ICD-10-CM

## 2021-05-16 DIAGNOSIS — F419 Anxiety disorder, unspecified: Secondary | ICD-10-CM | POA: Diagnosis not present

## 2021-05-16 DIAGNOSIS — F319 Bipolar disorder, unspecified: Secondary | ICD-10-CM | POA: Diagnosis not present

## 2021-05-27 ENCOUNTER — Ambulatory Visit: Admitting: Neurology

## 2021-05-30 ENCOUNTER — Ambulatory Visit (INDEPENDENT_AMBULATORY_CARE_PROVIDER_SITE_OTHER): Admitting: Psychology

## 2021-05-30 DIAGNOSIS — F3130 Bipolar disorder, current episode depressed, mild or moderate severity, unspecified: Secondary | ICD-10-CM

## 2021-05-30 DIAGNOSIS — F4381 Prolonged grief disorder: Secondary | ICD-10-CM | POA: Diagnosis not present

## 2021-05-30 DIAGNOSIS — F419 Anxiety disorder, unspecified: Secondary | ICD-10-CM

## 2021-06-12 ENCOUNTER — Ambulatory Visit (INDEPENDENT_AMBULATORY_CARE_PROVIDER_SITE_OTHER): Admitting: Psychology

## 2021-06-12 DIAGNOSIS — F331 Major depressive disorder, recurrent, moderate: Secondary | ICD-10-CM

## 2021-06-12 NOTE — Progress Notes (Addendum)
Date: 06/12/2021   Diagnosis Bipolar,  complicated grief, depression Symptoms unspecified Medication Status compliance  Safety none  If Suicidal or Homicidal State Action Taken: unspecified  Current Risk: low Medications See chart Objectives unspecified Client Response full compliance  Service Location Location, [address] [place], [place] [address]  Service Code cpt [phone]  Facilitate problem solving  Normalize/Reframe  Relaxation training  Self-monitoring  Self care activities  Emotion regulation skills  Validate/empathize  Behavioral activation plan  Comments  Dx. Bipolar 1,  and Complicated Grief  Meds:Clonazepam 1mg , Wellcall 625mg  (3, twice daily with food), dextroamphetamine 20mg  (1 in afternoon), Trulicity (1 injection/week), Zetia 10mg , Gabapentin 300mg  at bedtime, Metforman 500mg  twice daily, Toprol XL 50mg  in morning, Protonix 40mg  in evening, Actos 45 mg in morning, Rexulti 2 mg in morning, Trazedone 125mg , Effexor XR 150mg  (2 in morning), Vit. D2, Vivance 70mg  every morning, B12 and baby aspirin.  Goals: Patient states that she is seeking counseling to resolve depressive moods and manage complicated, unresolved grief. Goal date 11-23  Patient states that she is willing to have a video session due to the pandemic. She is at home and I am in my home office.   Patient states that she had a busy week last week. She donated many of her husband's belongings and said it was the right thing to do for her own grief process. It was a helpful exercise. Is feeling more optimistic about the holiday this year. She is going to decorate and send out holiday cards. She says she has been sleeping a lot lately. She says it is her depression and her boredom. Reports that her anxiety is much less and her depression is more dominant. Does get nervous at times, but it is not extreme or interfere with functioning.  Her self care is sporadic and marginal. She is not exercising and  meals could be healthier. Encouraged her to make a plan to improve overall health.   Tyrian Peart LEWIS Time: 12:05-1:00p 55 min.

## 2021-06-26 NOTE — Progress Notes (Signed)
PATIENT: Nancy Harris DOB: Mar 09, 1966  REASON FOR VISIT: Follow up HISTORY FROM: Patient PRIMARY NEUROLOGIST: Dr. Brett Fairy   HISTORY OF PRESENT ILLNESS: Today 06/27/21 Nancy Harris care today for follow-up with history of bipolar disorder, chronic insomnia, memory disturbance, and sleep apnea on CPAP.  Saw Dr. 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  Had neuropsychological testing, was largely within normal limits.  Her responses suggest she is depressed, discouraged, and withdrawal.  Cognitive dysfunction most likely related to ongoing psychiatric distress.  Not suggestive of any form of neurodegenerative illness. Had cognitive training via speech therapy. Seeing psychiatry, doesn't feel like much as changed in regards to her anxiety, depression, grief. They are adjusting her medications. Living alone. Just started a new job, Research scientist (physical sciences) at [employer], part-time. Going to try harder with time management not to be late. Using CPAP, doing well, uses most all night, no issues. Knows sleeping better, not sure she feels much different. Headaches are much less, 1 every few weeks, will take Advil, but usually doesn't need anything. Has been taken off Topamax, on less gabapentin and Klonopin. Seeing Dr. Cheryln Manly with psychology at [facility], doing VV every few weeks. CPAP has a card.   HISTORY  11/06/2020 Dr. Jannifer Franklin: Nancy Harris is a 55 year old right-handed white female with history of bipolar disorder.  The patient is followed through [facility] for her depression.  The patient has been noted to have significant fatigue during the day.  She was set up for sleep evaluation at showed evidence of sleep apnea, she has not yet been placed on CPAP.  The patient has chronic insomnia, she takes melatonin around 8 PM but does not really get sleep until 1 or 2 AM.  On weekends, she may sleep until afternoon.  She has to go to work at  around 11 AM, but she oftentimes shows up late.  She is now getting cognitive training through speech therapy, this is helping some.  She has had a lifelong problem with time management.  She was taken off of her Topamax which seems to have helped some with her headaches but she still has frequent early morning headaches that she wakes up with.  The patient reports a 20 pound weight gain in the last year.  She still has some ongoing chronic fatigue throughout the day but she takes the clonazepam 1 mg in the morning.  She is off of her sleeping pills at night, she has reduced the gabapentin to 300 mg twice daily.  She has had some increase in anxiety on the lower dose of gabapentin.  The patient returns to the office today for an evaluation.  REVIEW OF SYSTEMS: Out of a complete 14 system review of symptoms, the patient complains only of the following symptoms, and all other reviewed systems are negative.  See HPI  ALLERGIES: Allergies  Allergen Reactions   Latex Itching    HOME MEDICATIONS: Outpatient Medications Prior to Visit  Medication Sig Dispense Refill   aspirin EC 81 MG tablet Take 81 mg by mouth at bedtime.     BIOTIN PO Take 5,000 mcg by mouth at bedtime.     brexpiprazole (REXULTI) 2 MG TABS tablet Take 2 mg by mouth at bedtime.     clonazePAM (KLONOPIN) 1 MG tablet Take 1 mg by mouth daily after breakfast.     colesevelam (WELCHOL) 625 MG tablet Take 1,875 mg by mouth 2 (two) times daily with  a meal.     Cyanocobalamin (VITAMIN B-12 PO) Take 1,000 mcg by mouth every morning.     Dulaglutide (TRULICITY) 3 ZO/1.0RU SOPN Inject 3 mg into the skin once a week.     ezetimibe (ZETIA) 10 MG tablet      Ferrous Sulfate (IRON) 325 (65 Fe) MG TABS Take 1 tablet by mouth at bedtime.     gabapentin (NEURONTIN) 300 MG capsule Take 300 mg by mouth at bedtime.     levothyroxine (SYNTHROID) 125 MCG tablet Take 125 mcg by mouth daily.     metFORMIN (GLUCOPHAGE) 500 MG tablet Take 500 mg by mouth  2 (two) times daily with a meal.     metoprolol succinate (TOPROL-XL) 50 MG 24 hr tablet Take 50 mg by mouth daily after breakfast. Take with or immediately following a meal.     Omega-3 Fatty Acids (OMEGA 3 PO) Take 1,000 mg by mouth daily. After evening meal     pantoprazole (PROTONIX) 40 MG tablet Take 1 tablet (40 mg total) by mouth daily. 30 tablet 11   pioglitazone (ACTOS) 45 MG tablet Take 45 mg by mouth every morning.     rOPINIRole (REQUIP) 2 MG tablet Take 2 mg by mouth at bedtime.     traZODone (DESYREL) 100 MG tablet Take 150 mg by mouth at bedtime as needed.     venlafaxine XR (EFFEXOR-XR) 150 MG 24 hr capsule Take 300 mg by mouth daily with breakfast.     VYVANSE 70 MG capsule Take 70 mg by mouth daily.     dextroamphetamine (DEXTROSTAT) 10 MG tablet Take 10 mg by mouth daily.     Vitamin D, Ergocalciferol, (DRISDOL) 1.25 MG (50000 UT) CAPS capsule Take 50,000 Units by mouth as directed. Takes every other week     No facility-administered medications prior to visit.    PAST MEDICAL HISTORY: Past Medical History:  Diagnosis Date   Allergy    Anemia    past hx    Arthritis    Chronic insomnia 09/03/2020   Chronic kidney disease    Chronic tension-type headache, not intractable 08/15/2015   Diabetes mellitus without complication    Essential tremor 11/30/2012   Excessive daytime sleepiness 09/03/2020   Generalized anxiety disorder    GERD (gastroesophageal reflux disease)    History of skin cancer    Hyperlipidemia    Hypertension    Major depressive disorder    Neuromuscular disorder    HH, CTS, neuropathy feet and hands    Obstructive sleep apnea 11/06/2020   Paget's disease of bone 11/30/2017   Postmenopausal osteoporosis 11/30/2017   Thyroid disease     PAST SURGICAL HISTORY: Past Surgical History:  Procedure Laterality Date   CESAREAN SECTION  10/1990   CHOLECYSTECTOMY  10/2000   COLONOSCOPY     NASAL SEPTUM SURGERY     07/2008 or 07/2010- pt unsure     TOTAL ABDOMINAL HYSTERECTOMY  10/2004   UPPER GASTROINTESTINAL ENDOSCOPY      FAMILY HISTORY: Family History  Problem Relation Age of Onset   Alzheimer's disease Mother        early-onset; approximately 76   Alzheimer's disease Maternal Grandfather    Colon cancer Paternal Grandmother    Breast cancer Neg Hx    Colon polyps Neg Hx    Esophageal cancer Neg Hx    Rectal cancer Neg Hx    Stomach cancer Neg Hx     SOCIAL HISTORY: Social History   Socioeconomic History  Marital status: Widowed    Spouse name: Not on file   Number of children: Not on file   Years of education: 12   Highest education level: High school graduate  Occupational History   Occupation: Scientist, water quality    Comment: Part time  Tobacco Use   Smoking status: Never   Smokeless tobacco: Never  Substance and Sexual Activity   Alcohol use: Not Currently   Drug use: Never   Sexual activity: Not on file  Other Topics Concern   Not on file  Social History Narrative   Lives alone   Right Handed   Drinks caffeine occassionally   Social Determinants of Health   Financial Resource Strain: Not on file  Food Insecurity: Not on file  Transportation Needs: Not on file  Physical Activity: Not on file  Stress: Not on file  Social Connections: Not on file  Intimate Partner Violence: Not on file   PHYSICAL EXAM  Vitals:   06/27/21 0817  Weight: 180 lb (81.6 kg)  Height: 5' (1.524 m)   Body mass index is 35.15 kg/m.  Generalized: Well developed, in no acute distress  Neurological examination  Mentation: Alert oriented to time, place, history taking. Follows all commands speech and language fluent Cranial nerve II-XII: Pupils were equal round reactive to light. Extraocular movements were full, visual field were full on confrontational test. Facial sensation and strength were normal. Head turning and shoulder shrug  were normal and symmetric. Motor: The motor testing reveals 5 over 5 strength of all 4  extremities. Good symmetric motor tone is noted throughout.  Sensory: Sensory testing is intact to soft touch on all 4 extremities. No evidence of extinction is noted.  Coordination: Cerebellar testing reveals good finger-nose-finger and heel-to-shin bilaterally.  Gait and station: Gait is normal.  Reflexes: Deep tendon reflexes are symmetric and normal bilaterally.   DIAGNOSTIC DATA (LABS, IMAGING, TESTING) - I reviewed patient records, labs, notes, testing and imaging myself where available.  No results found for: WBC, HGB, HCT, MCV, PLT No results found for: NA, K, CL, CO2, GLUCOSE, BUN, CREATININE, CALCIUM, PROT, ALBUMIN, AST, ALT, ALKPHOS, BILITOT, GFRNONAA, GFRAA No results found for: CHOL, HDL, LDLCALC, LDLDIRECT, TRIG, CHOLHDL No results found for: HGBA1C Lab Results  Component Value Date   VITAMINB12 1,060 07/04/2020   Lab Results  Component Value Date   TSH 0.398 (L) 07/04/2020   ASSESSMENT AND PLAN 55 y.o. year old female  has a past medical history of Allergy, Anemia, Arthritis, Chronic insomnia (09/03/2020), Chronic kidney disease, Chronic tension-type headache, not intractable (08/15/2015), Diabetes mellitus without complication, Essential tremor (11/30/2012), Excessive daytime sleepiness (09/03/2020), Generalized anxiety disorder, GERD (gastroesophageal reflux disease), History of skin cancer, Hyperlipidemia, Hypertension, Major depressive disorder, Neuromuscular disorder, Obstructive sleep apnea (11/06/2020), Paget's disease of bone (11/30/2017), Postmenopausal osteoporosis (11/30/2017), and Thyroid disease. here with:  1.  Bipolar disorder, depression   2.  Sleep apnea   3.  Memory disturbance   4.  Chronic insomnia  -Nancy Harris is doing better since started CPAP, working with psychiatry/therapist, started new job recently, has weaned off Topamax, on less gabapentin and Klonopin -Continue CPAP use, saw Dr. Brett Fairy Apr 23, 2021, will need revisit for CPAP compliance around  October 2023 -Encouraged to continue routine follow-up with psychiatry -She will be followed by [facility] since Dr. Jannifer Franklin has retired, we will follow for CPAP compliance, we are not prescribing any medications  Butler Denmark, AGNP-C, DNP 06/27/2021, 8:40 AM [facility] [address] [place],  [place] [address] [phone]

## 2021-06-27 ENCOUNTER — Ambulatory Visit (INDEPENDENT_AMBULATORY_CARE_PROVIDER_SITE_OTHER): Admitting: Neurology

## 2021-06-27 ENCOUNTER — Encounter: Payer: Self-pay | Admitting: Neurology

## 2021-06-27 VITALS — Ht 60.0 in | Wt 180.0 lb

## 2021-06-27 DIAGNOSIS — F5104 Psychophysiologic insomnia: Secondary | ICD-10-CM

## 2021-06-27 DIAGNOSIS — Z9989 Dependence on other enabling machines and devices: Secondary | ICD-10-CM | POA: Diagnosis not present

## 2021-06-27 DIAGNOSIS — F332 Major depressive disorder, recurrent severe without psychotic features: Secondary | ICD-10-CM

## 2021-06-27 DIAGNOSIS — G4733 Obstructive sleep apnea (adult) (pediatric): Secondary | ICD-10-CM

## 2021-06-27 NOTE — Patient Instructions (Signed)
Continue CPAP use nightly, > 4 hours  Continue see your therapist and psychiatrist See you back in October to follow up on CPAP

## 2021-07-03 ENCOUNTER — Ambulatory Visit: Admitting: Psychology

## 2021-07-09 ENCOUNTER — Ambulatory Visit (INDEPENDENT_AMBULATORY_CARE_PROVIDER_SITE_OTHER): Admitting: Psychology

## 2021-07-09 DIAGNOSIS — F331 Major depressive disorder, recurrent, moderate: Secondary | ICD-10-CM | POA: Diagnosis not present

## 2021-07-09 NOTE — Progress Notes (Signed)
Nancy Harris is a 56 y.o. female patient  Date: 07/09/2021   Diagnosis Bipolar,  complicated grief, depression Symptoms unspecified Medication Status compliance  Safety none  If Suicidal or Homicidal State Action Taken: unspecified  Current Risk: low Medications See chart Objectives unspecified Client Response full compliance  Service Location Location, [address] [place], [place] [address]  Service Code cpt [phone]  Facilitate problem solving  Normalize/Reframe  Relaxation training  Self-monitoring  Self care activities  Emotion regulation skills  Validate/empathize  Behavioral activation plan  Comments  Dx. Bipolar 1,  and Complicated Grief  Meds:Clonazepam 1mg , Wellcall 625mg  (3, twice daily with food), dextroamphetamine 20mg  (1 in afternoon), Trulicity (1 injection/week), Zetia 10mg , Gabapentin 300mg  at bedtime, Metforman 500mg  twice daily, Toprol XL 50mg  in morning, Protonix 40mg  in evening, Actos 45 mg in morning, Rexulti 2 mg in morning, Trazedone 125mg , Effexor XR 150mg  (2 in morning), Vit. D2, Vivance 70mg  every morning, B12 and baby aspirin.  Goals: Patient states that she is seeking counseling to resolve depressive moods and manage complicated, unresolved grief. Goal date 11-23  Patient states that she is willing to have a video session due to the pandemic. She is at home and I am in my home office.   Patient states that she was alone for much of the holiday. She got together with some family members ahead of Christmas and found that enjoyable. Says that she got depressed and slept much of the time. Her job has not started yet and she anticipates it will be next week. It has been slower than anticipated. During this time, she feels she has "gone backwards emotionally". We talked about it as being a function of inactivity. We talked about the need for her to have a daily schedule and a "to do" list. She says her daughter is very driven, "unlike me". She says  she did not have a model for drive/work in her [place]. Her mother never worked. While [place] had jobs in the past that she liked, she never had a strong drive. She reports that she forgets her medicine at least once a week. We talked about strategies to help her be more compliant with medicine. She will meet meet with Lattie Haw P. Tomorrow to review her medication and will share some of her concerns. Will start a "to do" list this week.    Maily Debarge LEWIS Time: 11:35-12:30p 55 min.

## 2021-07-19 ENCOUNTER — Ambulatory Visit (INDEPENDENT_AMBULATORY_CARE_PROVIDER_SITE_OTHER): Admitting: Psychology

## 2021-07-19 DIAGNOSIS — F331 Major depressive disorder, recurrent, moderate: Secondary | ICD-10-CM | POA: Diagnosis not present

## 2021-07-19 NOTE — Progress Notes (Signed)
Nancy Harris is a 56 y.o. female patient  Date: 07/19/2021   Diagnosis Bipolar,  complicated grief, depression Symptoms unspecified Medication Status compliance  Safety none  If Suicidal or Homicidal State Action Taken: unspecified  Current Risk: low Medications See chart Objectives unspecified Client Response full compliance  Service Location Location, [address] [place], [place] [address]  Service Code cpt [phone]  Facilitate problem solving  Normalize/Reframe  Relaxation training  Self-monitoring  Self care activities  Emotion regulation skills  Validate/empathize  Behavioral activation plan  Comments  Dx. Bipolar 1,  and Complicated Grief  Meds:Clonazepam 1mg , Wellcall 625mg  (3, twice daily with food), dextroamphetamine 20mg  (1 in afternoon), Trulicity (1 injection/week), Zetia 10mg , Gabapentin 300mg  at bedtime, Metforman 500mg  twice daily, Toprol XL 50mg  in morning, Protonix 40mg  in evening, Actos 45 mg in morning, Rexulti 2 mg in morning, Trazedone 125mg , Prozac 40mg , Vit. D2, Vivance 70mg  every morning, B12 and baby aspirin.  Goals: Patient states that she is seeking counseling to resolve depressive moods and manage complicated, unresolved grief. Goal date 11-23 Patient states that she is willing to have a video session due to the pandemic. She is at home and I am in my home office.  Patient states she went to see the psychiatrists office and some changes were made to her medications. She is getting off of Effexor and will be starting Prozac. Will start with 20mg  Prozac and eventually increase to 40 mg after about 15 days of tapering off of the Effexor. It was also suggested that she stop Neurontin, but she says she wants to continue with that medicine until she finishes the bottle that was just refilled.  She says she still has not made the transition with her sleep schedule. She continues to go to bed in the middle of the night. One exception is when she went  to work (one day) and got to bed at midnight. We talked about barriers to her changing her sleep schedule and the need to move forward with it. She will try again this week to be in bed by midnight a couple of nights this week.  She forgot about putting together a "to do" list and will also work on that this week. Told her to include some self-care activities on the list (eg. Exercise, walking, etc.). Discussed good sleep hygiene.         Ethel Meisenheimer LEWIS Time: 11:40-12:30p 50 min.                                                                            Marcelina Morel, PhD

## 2021-08-01 ENCOUNTER — Ambulatory Visit (INDEPENDENT_AMBULATORY_CARE_PROVIDER_SITE_OTHER): Admitting: Psychology

## 2021-08-01 DIAGNOSIS — F331 Major depressive disorder, recurrent, moderate: Secondary | ICD-10-CM

## 2021-08-01 NOTE — Progress Notes (Signed)
° ° ° ° ° ° °  Nancy Harris is a 56 y.o. female patient  Date: 08/01/2021   Diagnosis Bipolar,  complicated grief, depression Symptoms unspecified Medication Status compliance  Safety none  If Suicidal or Homicidal State Action Taken: unspecified  Current Risk: low Medications See chart Objectives unspecified Client Response full compliance  Service Location Location, [address] [place], [place] [address]  Service Code cpt [phone]  Facilitate problem solving  Normalize/Reframe  Relaxation training  Self-monitoring  Self care activities  Emotion regulation skills  Validate/empathize  Behavioral activation plan  Comments  Dx. Bipolar 1,  and Complicated Grief  Meds:Clonazepam 1mg , Wellcall 625mg  (3, twice daily with food), dextroamphetamine 20mg  (1 in afternoon), Trulicity (1 injection/week), Zetia 10mg , Gabapentin 300mg  at bedtime, Metforman 500mg  twice daily, Toprol XL 50mg  in morning, Protonix 40mg  in evening, Actos 45 mg in morning, Rexulti 2 mg in morning, Trazedone 125mg , Prozac 40mg , Vit. D2, Vivance 70mg  every morning, B12 and baby aspirin.  Goals: Patient states that she is seeking counseling to resolve depressive moods and manage complicated, unresolved grief. Goal date 11-23 Patient states that she is willing to have a video session due to the pandemic. She is at home and I am in my home office.  Patient states she isn't feeling well and that the change of the medicine is making her sleepy. The job has not gone well because she isn't getting any hours. So she went to a job fair at [employer] and they are hiring seasonal help. She has an interview there tomorrow. She says her depression is "because nothing has been happening with work". She is having trouble getting up each day. She gets on couch and watches TV. She says she has not started her "to do" list because "I don't have anything to do".            Dervin Vore LEWIS Time: 2:15- 3:00p 50 min. 45  minutes.

## 2021-08-06 ENCOUNTER — Ambulatory Visit: Admitting: Neurology

## 2021-08-14 ENCOUNTER — Ambulatory Visit (INDEPENDENT_AMBULATORY_CARE_PROVIDER_SITE_OTHER): Admitting: Psychology

## 2021-08-14 DIAGNOSIS — F331 Major depressive disorder, recurrent, moderate: Secondary | ICD-10-CM

## 2021-08-14 NOTE — Progress Notes (Signed)
° ° ° ° ° ° ° ° ° ° ° ° ° ° ° ° ° ° ° ° ° °  Nancy Harris is a 56 y.o. female patient  Date: 08/14/2021   Diagnosis Bipolar,  complicated grief, depression Symptoms unspecified Medication Status compliance  Safety none  If Suicidal or Homicidal State Action Taken: unspecified  Current Risk: low Medications See chart Objectives unspecified Client Response full compliance  Service Location Location, [address] [place], [place] [address]  Service Code cpt [phone]  Facilitate problem solving  Normalize/Reframe  Relaxation training  Self-monitoring  Self care activities  Emotion regulation skills  Validate/empathize  Behavioral activation plan  Comments  Dx. Bipolar 1,  and Complicated Grief  Meds:Clonazepam 1mg , Wellcall 625mg  (3, twice daily with food), dextroamphetamine 20mg  (1 in afternoon), Trulicity (1 injection/week), Zetia 10mg , Gabapentin 300mg  at bedtime, Metforman 500mg  twice daily, Toprol XL 50mg  in morning, Protonix 40mg  in evening, Actos 45 mg in morning, Rexulti 2 mg in morning, Trazedone 125mg , Prozac 40mg , Vit. D2, Vivance 70mg  every morning, B12 and baby aspirin.  Goals: Patient states that she is seeking counseling to resolve depressive moods and manage complicated, unresolved grief. Goal date 11-23 Patient states that she is willing to have a video session due to the pandemic. She is at home and I am in my home office.  Patient states she is feeling "blah" for the past week. She is not sure why she feels she is in a fog and cannot get out of it. Still has not gotten her sleep schedule on track. She has not been given any hours to work. She has not been looking for another job thinking this one would work out. Told her to start looking. She ran out of her Vivance for a few days and that did not help. Affect is flat and she reports feeling depressed. Says she feels trapped and unable to get out of her hole. She has felt like this in the past. Thinks that a  family member tends to come to her rescue (usually her sister), but nobody is available this year. She said yesterday, Valentine's Day, made her especially sad because she was missing her husband. Her daughter has reached out, but she is busy and does not have a lot of time available.      Nancy Harris Time: 3:10- 3:55p 50 min. 45 minutes.

## 2021-08-28 ENCOUNTER — Ambulatory Visit (INDEPENDENT_AMBULATORY_CARE_PROVIDER_SITE_OTHER): Admitting: Psychology

## 2021-08-28 DIAGNOSIS — F331 Major depressive disorder, recurrent, moderate: Secondary | ICD-10-CM

## 2021-08-28 NOTE — Progress Notes (Signed)
? ? ? ? ? ? ? ? ? ? ? ? ? ? ? ? ? ? ? ? ? ? ? ? ? ? ? ? ? ? ? ? ? ? ? ? ?  Nancy Harris is a 56 y.o. female patient  ?Date: 08/28/2021  ? ?Diagnosis ?Bipolar,  complicated grief, depression ?Symptoms ?unspecified ?Medication Status ?compliance  ?Safety ?none  ?If Suicidal or Homicidal State Action Taken: unspecified  ?Current Risk: low ?Medications ?See chart ?Objectives ?unspecified ?Client Response ?full compliance  ?Service Location ?Location, [address] [place], [place] [address]  ?Service Code ?cpt [id number]  ?Facilitate problem solving  ?Normalize/Reframe  ?Relaxation training  ?Self-monitoring  ?Self care activities  ?Emotion regulation skills  ?Validate/empathize  ?Behavioral activation plan  ?Session notes:  ?Dx. Bipolar 1,  and Complicated Grief  ?Meds:Clonazepam 1mg , Wellcall 625mg  (3, twice daily with food), dextroamphetamine 20mg  (1 in afternoon), Trulicity (1 injection/week), Zetia 10mg , Gabapentin 300mg  at bedtime, Metforman 500mg  twice daily, Toprol XL 50mg  in morning, Protonix 40mg  in evening, Actos 45 mg in morning, Rexulti 2 mg in morning, Trazedone 125mg , Prozac 40mg , Vit. D2, Vivance 70mg  every morning, B12 and baby aspirin.  ?Goals: Patient states that she is seeking counseling to resolve depressive moods and manage complicated, unresolved grief. Goal date 11-23 ?Patient states that she is willing to have a video session due to the pandemic. She is at home and I am in my home office.  ?Patient says she is still feeling depressed and motivation has been difficult. Most days is sleeping til noon. She does not have a plan each day and ends up watching TV. Some days her daughter is available and she tries to get together with her daughter. Daughter is off Wednesdays and Fridays. I told her to write out a schedule each day.  ?She heard from [employer] yesterday and was terminated because they didn't have enough hours to train her. Provided her with information on services at the [facility]. Husband's birthday is March 5th and she is "already sad". Told her to make a plan to celebrate that day and acknowledge him and their relationship. Second anniversary of his death is in 11-20-2022.     ? ? ?Jeroline Wolbert LEWIS Time: 10:40- 11:30a 50 min.  ? ? ? ? ? ? ? ? ? ? ? ? ? ? ? ? ? ? ? ? ? ? ? ? ? ? ? ? ? ? ? ? ? ? ? ? ? ? ? ? ? ? ? ? ? ? ? ? ? ? ? ? ?  ? ? ? ? ? ? ? ? ? ? ? ? ? ? ? ? ? ? ? ? ? ? ?

## 2021-09-16 ENCOUNTER — Encounter: Payer: Self-pay | Admitting: Neurology

## 2021-09-17 ENCOUNTER — Ambulatory Visit: Admitting: Psychology

## 2021-09-17 DIAGNOSIS — F331 Major depressive disorder, recurrent, moderate: Secondary | ICD-10-CM | POA: Diagnosis not present

## 2021-09-17 NOTE — Progress Notes (Signed)
? ? ? ? ? ? ? ? ? ? ? ?  Nancy Harris is a 56 y.o. female patient  ?Date: 09/17/2021  ? ?Diagnosis ?Bipolar,  complicated grief, depression ?Symptoms ?unspecified ?Medication Status ?compliance  ?Safety ?none  ?If Suicidal or Homicidal State Action Taken: unspecified  ?Current Risk: low ?Medications ?See chart ?Objectives ?unspecified ?Client Response ?full compliance  ?Service Location ?Location, [address] [place], [place] [address]  ?Service Code ?cpt [id number]  ?Facilitate problem solving  ?Normalize/Reframe  ?Relaxation training  ?Self-monitoring  ?Self care activities  ?Emotion regulation skills  ?Validate/empathize  ?Behavioral activation plan  ?Session notes:  ?Dx. Bipolar 1,  and Complicated Grief  ?Meds:Cymbalta ('30mg'$  then increase to '60mg'$ ), Clonazepam '1mg'$ , Wellcall '625mg'$  (3, twice daily with food), dextroamphetamine '20mg'$  (1 in afternoon), Trulicity (1 injection/week), Zetia '10mg'$ , Gabapentin '300mg'$  at bedtime, Metforman '500mg'$  twice daily, Toprol XL '50mg'$  in morning, Protonix '40mg'$  in evening, Actos 45 mg in morning, Rexulti 2 mg in morning, Trazedone '150mg'$ , Vit. D2, Vivance '70mg'$  every morning, B12 and baby aspirin.  ?Goals: Patient states that she is seeking counseling to resolve depressive moods and manage complicated, unresolved grief. Goal date 11-23 ?Patient states that she is willing to have a video session due to the pandemic. She is at home and I am in my home office.  ?Patient states that she ended up not doing anything on her husband's birthday. She sent a text to his daughter but she did not reply. Went to see sister in [place] last week. Had a nice few days with her. She felt better for a short time and is feeling "in a hole again". She called her psychiatrist's off and they took her off Prozac and put her on '30mg'$  of Cymbalta. She started the med last Friday. Still tapering off of the Prozac. Her sister takes Cymbalta and it helps. ?She did her "to do" list a few times and it helped,  but she has been tired and not getting much done. She [facility], but will try this week. Suggested she get back in touch with hospice to check into a grief class.         ? ? ?Nancy Harris Time: 11:40- 12:30a 50 min.  ? ? ? ? ? ? ? ? ? ? ? ? ? ? ? ? ? ? ? ? ? ? ? ? ? ? ? ? ? ? ? ? ? ? ? ? ? ? ? ? ? ? ? ? ? ? ? ? ? ? ? ? ?  ? ? ? ? ? ? ? ? ? ? ? ? ? ? ? ? ? ? ? ? ? ? ?

## 2021-10-01 ENCOUNTER — Ambulatory Visit (INDEPENDENT_AMBULATORY_CARE_PROVIDER_SITE_OTHER): Admitting: Psychology

## 2021-10-01 DIAGNOSIS — F331 Major depressive disorder, recurrent, moderate: Secondary | ICD-10-CM | POA: Diagnosis not present

## 2021-10-01 NOTE — Progress Notes (Signed)
? ? ? ? ? ? ? ? ? ? ? ? ? ? ? ? ? ? ? ? ? ? ? ? ? ? ?Nancy Harris is a 56 y.o. female patient  ?Date: 10/01/2021  ? ?Diagnosis ?Bipolar,  complicated grief, depression ?Symptoms ?unspecified ?Medication Status ?compliance  ?Safety ?none  ?If Suicidal or Homicidal State Action Taken: unspecified  ?Current Risk: low ?Medications ?See chart ?Objectives ?unspecified ?Client Response ?full compliance  ?Service Location ?Location, [address] [place], [place] [address]  ?Service Code ?cpt [id number]  ?Facilitate problem solving  ?Normalize/Reframe  ?Relaxation training  ?Self-monitoring  ?Self care activities  ?Emotion regulation skills  ?Validate/empathize  ?Behavioral activation plan  ?Session notes:  ?Dx. Bipolar 1,  and Complicated Grief  ?Meds:Cymbalta ('30mg'$  then increase to '60mg'$ ), Clonazepam '1mg'$ , Wellcall '625mg'$  (3, twice daily with food), dextroamphetamine '20mg'$  (1 in afternoon), Trulicity (1 injection/week), Zetia '10mg'$ , Gabapentin '300mg'$  at bedtime, Metforman '500mg'$  twice daily, Toprol XL '50mg'$  in morning, Protonix '40mg'$  in evening, Actos 45 mg in morning, Rexulti 2 mg in morning, Trazedone '150mg'$ , Vit. D2, Vivance '70mg'$  every morning, B12 and baby aspirin.  ?Goals: Patient states that she is seeking counseling to resolve depressive moods and manage complicated, unresolved grief. Goal date 11-23 ?Patient states that she is willing to have a video session due to the pandemic. She is at home and I am in my home office.  ?Patient states she can tell a difference with the new medication regimen. She feels it is helping her moods and she is "feeling a little better". Her sleep is not good. She says it is tough falling asleep and staying asleep for an extended period. She is not napping in the day. Also feeling stressed and it shows up in her GI system. She is stressed now because she was supposed to take her application by the job day after her interview. Something has happened every day since, and she still has not  taken it in. The new job is 8-4 daily and she will need to be up at 6:00 each morning.   ?She is planning to go visit her [place] the beach before starting job next week. We talked about strategy to prepare for her job.  ?She bought a journal book off a web site that will help her keep a "to do" list. She has now used it for 2 days.  ?She states that she feels "a little better" than when she started therapy, but questions if she has made enough progress. She still has not contacted Hospice again. Says she wants to wait to see if she has the job and will then know the times she is available. It will be 2 years since her husband passed and she found the group helpful. She went to a [employer]) a couple of weeks ago and found it helpful. Felt that she is loved. Talked about her spirituality and defining what level will be therapeutic for her.  ?She will bring journal and "to do" list to next session with a narrative section explaining grief episodes. ?           ? ? ?Ethanael Veith LEWIS Time: 12:05- 1:00a 55 min.  ? ? ? ? ? ? ? ? ? ? ? ? ? ? ? ? ? ? ? ? ? ? ? ? ? ? ? ? ? ? ? ? ? ? ? ? ? ? ? ? ? ? ? ? ? ? ? ? ? ? ? ? ?  ? ? ? ? ? ? ? ? ? ? ? ? ? ? ? ? ? ? ? ? ? ? ?

## 2021-10-09 ENCOUNTER — Other Ambulatory Visit: Payer: Self-pay | Admitting: Family Medicine

## 2021-10-09 DIAGNOSIS — Z1231 Encounter for screening mammogram for malignant neoplasm of breast: Secondary | ICD-10-CM

## 2021-10-15 ENCOUNTER — Ambulatory Visit: Admitting: Psychology

## 2021-10-24 ENCOUNTER — Ambulatory Visit

## 2021-10-29 ENCOUNTER — Ambulatory Visit
Admission: RE | Admit: 2021-10-29 | Discharge: 2021-10-29 | Disposition: A | Discharge status: Home / Self Care | Source: Ambulatory Visit | Attending: Family Medicine | Admitting: Family Medicine

## 2021-10-29 DIAGNOSIS — Z1231 Encounter for screening mammogram for malignant neoplasm of breast: Secondary | ICD-10-CM

## 2021-11-21 ENCOUNTER — Ambulatory Visit: Admitting: Psychology

## 2021-11-21 DIAGNOSIS — F331 Major depressive disorder, recurrent, moderate: Secondary | ICD-10-CM

## 2021-11-21 NOTE — Progress Notes (Signed)
                                           Nancy Harris is a 56 y.o. female patient  Date: 11/21/2021   Diagnosis Bipolar,  complicated grief, depression Symptoms unspecified Medication Status compliance  Safety none  If Suicidal or Homicidal State Action Taken: unspecified  Current Risk: low Medications See chart Objectives unspecified Client Response full compliance  Service Location Location, [address] [place], [place] [address]  Service Code cpt [phone]  Facilitate problem solving  Normalize/Reframe  Relaxation training  Self-monitoring  Self care activities  Emotion regulation skills  Validate/empathize  Behavioral activation plan  Session notes:  Dx. Bipolar 1,  and Complicated Grief  Meds:Cymbalta ('30mg'$  then increase to '90mg'$ ), Clonazepam '1mg'$ , Wellcall '625mg'$  (3, twice daily with food), dextroamphetamine '20mg'$  (1 in afternoon), Trulicity (1 injection/week), Zetia '10mg'$ , Gabapentin '300mg'$  at bedtime, Metforman '500mg'$  twice daily, Toprol XL '50mg'$  in morning, Protonix '40mg'$  in evening, Actos 45 mg in morning, Rexulti 2 mg in morning, Trazedone '200mg'$ , Vit. D2, Vivance '70mg'$  every morning, B12 and baby aspirin.  Goals: Patient states that she is seeking counseling to resolve depressive moods and manage complicated, unresolved grief. Goal date 11-23 Patient states that she is willing to have a video session due to the pandemic. She is at home and I am in my home office.  Patient states that she started her job. She says it has been a horrible week. She has been late 2 times and received a warning today.  [name] changed her medicine and she says she has not been getting much sleep. This is compounded by the fact that this week is the anniversary of her husband's death 2022/10/03). Prior to this week she has only been a couple of minutes late. The problem is that she is not able to get to sleep. Does well at work but gets sad when at  home. She has been at work a month. We talked about improving sleep hygiene at night. Suggested [employer] and reading, which has worked in the past.                [place] Time: 1:00p- 1:30p 30 min.

## 2021-12-23 ENCOUNTER — Ambulatory Visit: Admitting: Psychology

## 2021-12-23 DIAGNOSIS — F331 Major depressive disorder, recurrent, moderate: Secondary | ICD-10-CM | POA: Diagnosis not present

## 2021-12-23 NOTE — Progress Notes (Signed)
                                                          Nancy Harris is a 56 y.o. female patient  Date: 12/23/2021   Diagnosis Bipolar,  complicated grief, depression Symptoms unspecified Medication Status compliance  Safety none  If Suicidal or Homicidal State Action Taken: unspecified  Current Risk: low Medications See chart Objectives unspecified Client Response full compliance  Service Location Location, [address] [place], [place] [address]  Service Code cpt [phone]  Facilitate problem solving  Normalize/Reframe  Relaxation training  Self-monitoring  Self care activities  Emotion regulation skills  Validate/empathize  Behavioral activation plan  Session notes:  Dx. Bipolar 1,  and Complicated Grief  Meds:Cymbalta (30mg  then increase to 90mg ), Clonazepam 1mg , Wellcall 625mg  (3, twice daily with food), dextroamphetamine 20mg  (1 in afternoon), Trulicity (1 injection/week), Zetia 10mg , Gabapentin 300mg  at bedtime, Metforman 500mg  twice daily, Toprol XL 50mg  in morning, Protonix 40mg  in evening, Actos 45 mg in morning, Rexulti 2 mg in morning, Trazedone 200mg , Vit. D2, Vivance 70mg  every morning, B12 and baby aspirin.  Goals: Patient states that she is seeking counseling to resolve depressive moods and manage complicated, unresolved grief. Goal date 11-23 Patient states that she is willing to have a video session. She is at home and I am in my home office.  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 She is hopeful. Was initially very distressed, but rebounded. 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  She has been spending time with her daughter and her son, which has been a relief from feeling down. She would like to have more  contact with the grandchildren, but her daughter is not comfortable with her driving. She rarely asks Nancy Harris to sit with the kids. We talked about getting more focused on physical self-care. Suggested that she start taking short walks and creating a daily schedule for her self.   `                 Nancy Harris Time: 9:40a- 10:30a 50 min.

## 2021-12-30 ENCOUNTER — Ambulatory Visit: Admitting: Psychology

## 2021-12-30 DIAGNOSIS — F331 Major depressive disorder, recurrent, moderate: Secondary | ICD-10-CM

## 2021-12-30 NOTE — Progress Notes (Signed)
                                                                         Nancy Harris is a 56 y.o. female patient  Date: 12/30/2021   Diagnosis Bipolar,  complicated grief, depression Symptoms unspecified Medication Status compliance  Safety none  If Suicidal or Homicidal State Action Taken: unspecified  Current Risk: low Medications See chart Objectives unspecified Client Response full compliance  Service Location Location, [address] [place], [place] [address]  Service Code cpt [phone]  Facilitate problem solving  Normalize/Reframe  Relaxation training  Self-monitoring  Self care activities  Emotion regulation skills  Validate/empathize  Behavioral activation plan  Session notes:  Dx. Bipolar 1,  and Complicated Grief  Meds:Cymbalta ('30mg'$  then increase to '90mg'$ ), Clonazepam '1mg'$ , Wellcall '625mg'$  (3, twice daily with food), dextroamphetamine '20mg'$  (1 in afternoon), Trulicity (1 injection/week), Zetia '10mg'$ , Gabapentin '300mg'$  at bedtime, Metforman '500mg'$  twice daily, Toprol XL '50mg'$  in morning, Protonix '40mg'$  in evening, Actos 45 mg in morning, Rexulti 2 mg in morning, Trazedone '200mg'$ , Vit. D2, Vivance '70mg'$  every morning, B12 and baby aspirin.  Goals: Patient states that she is seeking counseling to resolve depressive moods and manage complicated, unresolved grief. Goal date 11-23 Patient states that she is willing to have a video session. She is at home and I am in my home office.  Patient says there has not been any new developments on the job front. Has not submitted any additional applications. Trying to stay positive and keep from being depressed about her situation. She is staying in touch with friends to maintain some social connections.  States that last Friday, she had her hearing tested. During the test, she had to repeat words "death and dying". This triggered a grief reaction. 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 Her tests, however, were good for her hearing. She said she still has not started to exercise. We talked about ways to do this that are manageable and with which she is likely to initiate. Also, suggested again that she go to [facility] and practice exercises that will initiate sleep.      `                 Nancy Harris Time: 10:40a- 11:30a 50 min.

## 2022-01-16 ENCOUNTER — Ambulatory Visit: Admitting: Psychology

## 2022-01-16 DIAGNOSIS — F331 Major depressive disorder, recurrent, moderate: Secondary | ICD-10-CM | POA: Diagnosis not present

## 2022-01-16 NOTE — Progress Notes (Signed)
Nancy Harris is a 56 y.o. female patient  Date: 01/16/2022   Diagnosis Bipolar,  complicated grief, depression Symptoms unspecified Medication Status compliance  Safety none  If Suicidal or Homicidal State Action Taken: unspecified  Current Risk: low Medications See chart Objectives unspecified Client Response full compliance  Service Location Location, [address] [place], [place] [address]  Service Code cpt [phone]  Facilitate problem solving  Normalize/Reframe  Relaxation training  Self-monitoring  Self care activities  Emotion regulation skills  Validate/empathize  Behavioral activation plan  Session notes:  Dx. Bipolar 1,  and Complicated Grief  Meds:Cymbalta (40m then increase to 925m, Clonazepam 60m56mWellcall 625m69m, twice daily with food), dextroamphetamine 20mg57min afternoon), Trulicity (1 injection/week), Zetia 10mg,660mapentin 300mg a97mdtime, Metforman 500mg tw17mdaily, Toprol XL 50mg in 57ming, Protonix 40mg in e36mng, Actos 45 mg in morning, Rexulti 2 mg in morning, Trazedone 200mg, Vit.73m Vivance 70mg every 83ming, B12 and baby aspirin.  Goals: Patient states that she is seeking counseling to resolve depressive moods and manage complicated, unresolved grief. Goal date 11-23 Patient states that she is willing to have a video session. She is at home and I am in my home office.  Patient says that she has not received any job offers and has little emotional energy to get motivated to apply. She is not getting much done and is feeling lazy. Says she does not really feeling depressed. In fact, she has started talking to a man on line and is getting along very well. She is hoping to meet with him in person next month. He is an internationaChild psychotherapist busy. They  met on [employer]. She says that this man "makes her laugh". Her moods are overall much better now that she has this new "friend". All communications have been text to date. She says she is back to reading and is now able to remember what she has read, which was a problem before. She feels her memory is improved and she can follow the story better than before. Her recent labs came back normal and her A1C is good. Only problem was kidney function. 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 Her doctor told her that some of her hearing problems may be due to her depression medicine. She does feel that the new depression medicine is working better than previous meds. Her depression is significantly less. She has been trying to get together with her daughter, but their schedules do not always work. She also has missed some opportunities to see grandchildren due to illness (headache and GI problems). She thinks it is her "nerves". She thinks that the anxiety is about not having a job for a month and she is also nervous about meeting the man she has been talking to. Bonnita alsOceanessing about some of the problems with her nieces and nephews. We talked about need to set limits/boundaries so she can let go of  the worry.            `                 Mehlani Blankenburg LEWIS Time: 10:40a- 11:30a 50 min.

## 2022-02-28 ENCOUNTER — Ambulatory Visit: Admitting: Psychology

## 2022-02-28 DIAGNOSIS — T63331A Toxic effect of venom of brown recluse spider, accidental (unintentional), initial encounter: Secondary | ICD-10-CM

## 2022-02-28 DIAGNOSIS — F331 Major depressive disorder, recurrent, moderate: Secondary | ICD-10-CM | POA: Diagnosis not present

## 2022-02-28 HISTORY — DX: Toxic effect of venom of brown recluse spider, accidental (unintentional), initial encounter: T63.331A

## 2022-02-28 NOTE — Progress Notes (Signed)
                                                                                                       Nancy Harris is a 56 y.o. female patient  Date: 02/28/2022   Diagnosis Bipolar,  complicated grief, depression Symptoms unspecified Medication Status compliance  Safety none  If Suicidal or Homicidal State Action Taken: unspecified  Current Risk: low Medications See chart Objectives unspecified Client Response full compliance  Service Location Location, [address] [place], [place] [address]  Service Code cpt [phone]  Facilitate problem solving  Normalize/Reframe  Relaxation training  Self-monitoring  Self care activities  Emotion regulation skills  Validate/empathize  Behavioral activation plan  Session notes:  Dx. Bipolar 1,  and Complicated Grief  Meds:Cymbalta ('30mg'$  then increase to '90mg'$ ), Clonazepam '1mg'$ , Wellcall '625mg'$  (3, twice daily with food), dextroamphetamine '20mg'$  (1 in afternoon), Trulicity (1 injection/week), Zetia '10mg'$ , Gabapentin '300mg'$  at bedtime, Metforman '500mg'$  twice daily, Toprol XL '50mg'$  in morning, Protonix '40mg'$  in evening, Actos 45 mg in morning, Rexulti 2 mg in morning, Trazedone '200mg'$ , Vit. D2, Vivance '70mg'$  every morning, B12 and baby aspirin.  Goals: Patient states that she is seeking counseling to resolve depressive moods and manage complicated, unresolved grief. Goal date 11-23 Patient states that she is willing to have a video session. She is at home and I am in my home office.  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 After she is certified, she will be hired in December. She is still looking at other places for jobs, but has not heard back. She is still not exercising or making efforts to take care of herself. She says that the financial pressures are creating a  lot of stress. She feels both anxiety and depression. Can only sleep for 4 hours at a time. Goes to bed at 1 or 2a, sleeps 4 hours and is then up. Will usually take a nap if she has only slept 4 hours. Still takes Cymbalta, Vivance and Trazodone. She stays worried about her bills. Not behind at this point but feels she cannot hold out much longer without getting behind.                `                 Nancy Harris Time: 11:40a- 12:15p 35 min.

## 2022-03-05 DIAGNOSIS — L0231 Cutaneous abscess of buttock: Secondary | ICD-10-CM | POA: Insufficient documentation

## 2022-03-06 ENCOUNTER — Telehealth: Payer: Self-pay

## 2022-03-06 NOTE — Telephone Encounter (Signed)
Received Medical Clearance PW for [facility]. I have placed on NP's desk for review and signature if appropriate.

## 2022-03-06 NOTE — Telephone Encounter (Signed)
PW reveiwed and signed by NP. I have faxed back to [facility] # [phone], confirmation received.

## 2022-03-13 DIAGNOSIS — L03317 Cellulitis of buttock: Secondary | ICD-10-CM | POA: Insufficient documentation

## 2022-04-11 ENCOUNTER — Ambulatory Visit: Admitting: Psychology

## 2022-04-11 DIAGNOSIS — F331 Major depressive disorder, recurrent, moderate: Secondary | ICD-10-CM

## 2022-04-11 NOTE — Progress Notes (Signed)
                                                                                                                      Nancy Harris is a 56 y.o. female patient  Date: 04/11/2022   Diagnosis Bipolar,  complicated grief, depression Symptoms unspecified Medication Status compliance  Safety none  If Suicidal or Homicidal State Action Taken: unspecified  Current Risk: low Medications See chart Objectives unspecified Client Response full compliance  Service Location Location, [address] [place], [place] [address]  Service Code cpt [phone]  Facilitate problem solving  Normalize/Reframe  Relaxation training  Self-monitoring  Self care activities  Emotion regulation skills  Validate/empathize  Behavioral activation plan  Session notes:  Dx. Bipolar 1,  and Complicated Grief  Meds:Cymbalta ('30mg'$  then increase to '90mg'$ ), Clonazepam '1mg'$ , Wellcall '625mg'$  (3, twice daily with food), dextroamphetamine '20mg'$  (1 in afternoon), Trulicity (1 injection/week), Zetia '10mg'$ , Gabapentin '300mg'$  at bedtime, Metforman '500mg'$  twice daily, Toprol XL '50mg'$  in morning, Protonix '40mg'$  in evening, Actos 45 mg in morning, Rexulti 2 mg in morning, Trazedone '200mg'$ , Vit. D2, Vivance '70mg'$  every morning, B12 and baby aspirin.  Goals: Patient states that she is seeking counseling to resolve depressive moods and manage complicated, unresolved grief. Goal date 11-23 Patient states that she is willing to have a video session. She is at home and I am in my home office.  Patient says she got bit by a brown recluse spider and was in hospital for 6 days on anti-biotics. She had tissue damage, but did not have to have surgery. 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 She has a consultation at [facility]  on Nov. 16th. 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 She has re-enrolled and will get to finish before her next doctor's appointment. She has situational depression and we talked about ways to manage.                   `                 Nancy Harris Time: 11:30a- 12:00p 30 min.

## 2022-04-16 ENCOUNTER — Encounter: Payer: Self-pay | Admitting: Allergy and Immunology

## 2022-04-16 ENCOUNTER — Other Ambulatory Visit: Payer: Self-pay | Admitting: Family

## 2022-04-16 ENCOUNTER — Ambulatory Visit: Admitting: Family

## 2022-04-16 VITALS — BP 114/82 | HR 72 | Resp 14 | Ht 59.4 in | Wt 176.6 lb

## 2022-04-16 DIAGNOSIS — R062 Wheezing: Secondary | ICD-10-CM | POA: Diagnosis not present

## 2022-04-16 DIAGNOSIS — R053 Chronic cough: Secondary | ICD-10-CM | POA: Diagnosis not present

## 2022-04-16 DIAGNOSIS — J302 Other seasonal allergic rhinitis: Secondary | ICD-10-CM | POA: Diagnosis not present

## 2022-04-16 DIAGNOSIS — K219 Gastro-esophageal reflux disease without esophagitis: Secondary | ICD-10-CM | POA: Diagnosis not present

## 2022-04-16 MED ORDER — FLUTICASONE PROPIONATE 50 MCG/ACT NA SUSP: NASAL | 5 refills | Status: DC

## 2022-04-16 MED ORDER — FLUTICASONE-SALMETEROL 115-21 MCG/ACT IN AERO: INHALATION_SPRAY | RESPIRATORY_TRACT | 2 refills | Status: DC

## 2022-04-16 MED ORDER — ALBUTEROL SULFATE HFA 108 (90 BASE) MCG/ACT IN AERS: INHALATION_SPRAY | RESPIRATORY_TRACT | 1 refills | Status: DC

## 2022-04-16 MED ORDER — PANTOPRAZOLE SODIUM 40 MG PO TBEC: DELAYED_RELEASE_TABLET | ORAL | 1 refills | Status: DC

## 2022-04-16 NOTE — Patient Instructions (Addendum)
Reflux Start pantoprazole 40 mg twice a day Continue Pepcid 40 mg once a day Decrease caffeine and chocolate consumption See dietary and lifestyle modifications below When you feel the need to clear your throat take a drink of water instead  Seasonal allergic rhinitis Percutaneous skin testing today was negative with a good histamine response.  Intradermal skin testing was positive to grass mix.  Copy of test given. Start avoidance measures as below May use an over-the-counter antihistamine such as Claritin (loratadine), Zyrtec (cetirizine) , Allegra (fexofenadine), or Xyzal (levocetirizine) once a day as needed for runny nose/itching Start Flonase (fluticasone) 1 to 2 sprays each nostril once a day as needed for stuffy nose.In the right nostril, point the applicator out toward the right ear. In the left nostril, point the applicator out toward the left ear  Cough/wheeze Get PA/lateral chest x-ray due to cough. We will call you with results once they are back. Start Symbicort 80/4.5 mcg 2 puffs twice a day with spacer to help prevent cough and wheeze. Spacer given with demonstration May use albuterol 2 puffs every 4-6 hours as needed for cough,wheeze, tightness in chest, or shortness of breath Consider alpha 1 antitrypsin level in future due to strong family history of asthma  It was a pleasure to meet you today Schedule a follow-up appointment in 4 to 6 weeks with Dr. Neldon Mc  Lifestyle Changes for Controlling GERD When you have GERD, stomach acid feels as if it's backing up toward your mouth. Whether or not you take medication to control your GERD, your symptoms can often be improved with lifestyle changes.   Raise Your Head Reflux is more likely to strike when you're lying down flat, because stomach fluid can flow backward more easily. Raising the head of your bed 4-6 inches can help. To do this: Slide blocks or books under the legs at the head of your bed. Or, place a wedge  under the mattress. Many foam stores can make a suitable wedge for you. The wedge should run from your waist to the top of your head. Don't just prop your head on several pillows. This increases pressure on your stomach. It can make GERD worse.  Watch Your Eating Habits Certain foods may increase the acid in your stomach or relax the lower esophageal sphincter, making GERD more likely. It's best to avoid the following: Coffee, tea, and carbonated drinks (with and without caffeine) Fatty, fried, or spicy food Mint, chocolate, onions, and tomatoes Any other foods that seem to irritate your stomach or cause you pain  Relieve the Pressure Eat smaller meals, even if you have to eat more often. Don't lie down right after you eat. Wait a few hours for your stomach to empty. Avoid tight belts and tight-fitting clothes. Lose excess weight.  Tobacco and Alcohol Avoid smoking tobacco and drinking alcohol. They can make GERD symptoms worse.   Reducing Pollen Exposure The American Academy of Allergy, Asthma and Immunology suggests the following steps to reduce your exposure to pollen during allergy seasons. Do not hang sheets or clothing out to dry; pollen may collect on these items. Do not mow lawns or spend time around freshly cut grass; mowing stirs up pollen. Keep windows closed at night.  Keep car windows closed while driving. Minimize morning activities outdoors, a time when pollen counts are usually at their highest. Stay indoors as much as possible when pollen counts or humidity is high and on windy days when pollen tends to remain in the air longer.  Use air conditioning when possible.  Many air conditioners have filters that trap the pollen spores. Use a HEPA room air filter to remove pollen form the indoor air you breathe.

## 2022-04-16 NOTE — Progress Notes (Addendum)
NEW PATIENT  Date of Service/Encounter:  04/16/22  Referring provider: Marco Collie, MD   Assessment:   Chronic cough - Plan: Spirometry with Graph  Wheeze - Plan: Spirometry with Graph  Gastroesophageal reflux disease, unspecified whether esophagitis present  Other seasonal allergic rhinitis - Plan: Allergy Test   Seasonal Influenza Vaccine:  yes, will get in November with her primary care physician       Plan/Recommendations:    Patient Instructions  Reflux Start pantoprazole 40 mg twice a day Continue Pepcid 40 mg once a day Decrease caffeine and chocolate consumption See dietary and lifestyle modifications below When you feel the need to clear your throat take a drink of water instead  Seasonal allergic rhinitis Percutaneous skin testing today was negative with a good histamine response.  Intradermal skin testing was positive to grass mix.  Copy of test given. Start avoidance measures as below May use an over-the-counter antihistamine such as Claritin (loratadine), Zyrtec (cetirizine) , Allegra (fexofenadine), or Xyzal (levocetirizine) once a day as needed for runny nose/itching Start Flonase (fluticasone) 1 to 2 sprays each nostril once a day as needed for stuffy nose.In the right nostril, point the applicator out toward the right ear. In the left nostril, point the applicator out toward the left ear  Cough/wheeze Get PA/lateral chest x-ray due to cough. We will call you with results once they are back. Start Symbicort 80/4.5 mcg 2 puffs twice a day with spacer to help prevent cough and wheeze. Spacer given with demonstration May use albuterol 2 puffs every 4-6 hours as needed for cough,wheeze, tightness in chest, or shortness of breath Consider alpha 1 antitrypsin level in future due to strong family history of asthma  It was a pleasure to meet you today Schedule a follow-up appointment in 4 to 6 weeks with Dr. Neldon Mc  Lifestyle Changes for Controlling  GERD When you have GERD, stomach acid feels as if it's backing up toward your mouth. Whether or not you take medication to control your GERD, your symptoms can often be improved with lifestyle changes.   Raise Your Head Reflux is more likely to strike when you're lying down flat, because stomach fluid can flow backward more easily. Raising the head of your bed 4-6 inches can help. To do this: Slide blocks or books under the legs at the head of your bed. Or, place a wedge under the mattress. Many foam stores can make a suitable wedge for you. The wedge should run from your waist to the top of your head. Don't just prop your head on several pillows. This increases pressure on your stomach. It can make GERD worse.  Watch Your Eating Habits Certain foods may increase the acid in your stomach or relax the lower esophageal sphincter, making GERD more likely. It's best to avoid the following: Coffee, tea, and carbonated drinks (with and without caffeine) Fatty, fried, or spicy food Mint, chocolate, onions, and tomatoes Any other foods that seem to irritate your stomach or cause you pain  Relieve the Pressure Eat smaller meals, even if you have to eat more often. Don't lie down right after you eat. Wait a few hours for your stomach to empty. Avoid tight belts and tight-fitting clothes. Lose excess weight.  Tobacco and Alcohol Avoid smoking tobacco and drinking alcohol. They can make GERD symptoms worse.   Reducing Pollen Exposure The American Academy of Allergy, Asthma and Immunology suggests the following steps to reduce your exposure to pollen during allergy seasons. Do not  hang sheets or clothing out to dry; pollen may collect on these items. Do not mow lawns or spend time around freshly cut grass; mowing stirs up pollen. Keep windows closed at night.  Keep car windows closed while driving. Minimize morning activities outdoors, a time when pollen counts are usually at their  highest. Stay indoors as much as possible when pollen counts or humidity is high and on windy days when pollen tends to remain in the air longer. Use air conditioning when possible.  Many air conditioners have filters that trap the pollen spores. Use a HEPA room air filter to remove pollen form the indoor air you breathe.     Subjective:   Nancy Harris is a 56 y.o. 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  Nancy Harris was referred by Marco Collie, MD.     Nancy Harris is a 56 y.o. female presenting for an evaluation of chronic cough .   Asthma/Respiratory Symptom History: Reports a dry cough mostly but always has a sensation of phlegm in her throat.  The cough started a year ago.  She is a non-smoker but has been exposed to secondhand smoke her whole life with her parents and her now deceased husband.  She also reports wheezing with exertion.  She denies tightness in chest and shortness of breath.  The cough does wake her up at night.  She does  wear CPAP at night and this dries out her mouth.  She has never been diagnosed with asthma and does not have an albuterol inhaler.  She does have 3 siblings that have been diagnosed with asthma.  Allergic Rhinitis Symptom History: She reports that she has seen [place] allergist many years ago and she is not sure if she was allergic to anything.  She thinks she was skin tested.  She reports always having postnasal drip, her nose stays moist, and she has nasal congestion.  She has tried every over-the-counter antihistamine for 6 months to year  and they have not been helpful.  She will also occasionally use Flonase and this helps for a little while.  She is not consistent with Flonase use.  Her symptoms are year-round but also seasonal.  She has never done allergy injections.  She does have 1 cat inside the house.  She does not feel like the cat causes her to have any symptoms.  Reflux: She is currently taking famotidine 40 mg at night and pantoprazole 40 mg every other day.  She reports reflux symptoms depending on what she is eating and  sometimes it will happen if she just drinks water.  She used to be on pantoprazole every day, but mentions that her primary care physician changed it to every other day stating that this could be the cause of her cough.  She occasionally will drink caffeinated drinks and eat chocolate, but she mainly tries to drink decaffeinated drinks.  She does clear her throat often.    Otherwise, there is no history of other atopic diseases, including food allergies, stinging insect allergies, eczema, urticaria, or contact dermatitis. There is no significant infectious history. Vaccinations are up to date.  Does plan on getting her influenza vaccine this November.   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   Inadequate sleep hygiene 04/23/2021   Neuropathy associated with  endocrine disorder ([place]) 04/23/2021   Generalized anxiety disorder    Chronic kidney disease    Diabetes mellitus without complication    GERD (gastroesophageal reflux disease)    Hyperlipidemia    Hypertension    Thyroid disease    Obstructive sleep apnea 11/06/2020   Excessive daytime sleepiness 09/03/2020   Polypharmacy 09/03/2020   Chronic insomnia 09/03/2020   Major depressive disorder    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 If you have any questions, ask your nurse or doctor.          STOP taking these medications    gabapentin 300 MG capsule Commonly known as: NEURONTIN Stopped by: Althea Charon, FNP   venlafaxine XR 150 MG 24 hr capsule Commonly known as: EFFEXOR-XR Stopped by: Althea Charon, FNP       TAKE these medications    albuterol 108 (90 Base) MCG/ACT inhaler Commonly known as: VENTOLIN HFA Inhale 2 puffs every 4-6 hours as needed for cough, wheeze, tightness in the chest, or shortness of breath Started by: Althea Charon, FNP   aspirin EC 81 MG tablet Take 81 mg by mouth at bedtime.   BIOTIN PO Take 5,000 mcg by mouth at bedtime.   brexpiprazole 2 MG Tabs tablet Commonly known as: REXULTI Take 2 mg by mouth at bedtime.   CALCIUM-VITAMIN D PO Take by mouth daily.   clonazePAM 1 MG tablet Commonly known as: KLONOPIN Take 1 mg by mouth daily after breakfast.   colesevelam 625 MG tablet Commonly known as: WELCHOL Take 1,875 mg by mouth 2 (two) times daily with a meal.   DEXTROAMPHETAMINE SULFATE PO Take 20 mg by mouth daily.   DULoxetine 30 MG capsule Commonly known as: CYMBALTA Take 90 mg by mouth daily.   ezetimibe 10 MG tablet Commonly  known as: ZETIA   famotidine 40 MG tablet Commonly known as: PEPCID Take 40 mg by mouth daily.   fluticasone 50 MCG/ACT nasal spray Commonly known as: FLONASE Place 1 to 2 sprays in each nostril once a day as needed for stuffy nose Started by: Althea Charon, FNP   fluticasone-salmeterol [id number] MCG/ACT inhaler Commonly known as: Advair HFA Inhale 2 puffs twice a day with spacer to help prevent cough and wheeze Started by: Althea Charon,  FNP   Iron 325 (65 Fe) MG Tabs Take 1 tablet by mouth at bedtime.   levothyroxine 125 MCG tablet Commonly known as: SYNTHROID Take 125 mcg by mouth daily.   loratadine 10 MG tablet Commonly known as: CLARITIN Take 10 mg by mouth daily.   metFORMIN 500 MG tablet Commonly known as: GLUCOPHAGE Take 500 mg by mouth 2 (two) times daily with a meal.   metFORMIN 500 MG 24 hr tablet Commonly known as: GLUCOPHAGE-XR Take 500 mg by mouth 2 (two) times daily.   metoprolol succinate 50 MG 24 hr tablet Commonly known as: TOPROL-XL Take 50 mg by mouth daily after breakfast. Take with or immediately following a meal.   OMEGA 3 PO Take 1,000 mg by mouth daily. After evening meal   pantoprazole 40 MG tablet Commonly known as: Protonix Take 1 tablet (40 mg total) by mouth daily. What changed: Another medication with the same name was added. Make sure you understand how and when to take each. Changed by: Althea Charon, FNP   pantoprazole 40 MG tablet Commonly known as: Protonix Take 1 tablet twice a day 30 minutes before breakfast and dinner What changed: You were already taking a medication with the same name, and this prescription was added. Make sure you understand how and when to take each. Changed by: Althea Charon, FNP   pioglitazone 45 MG tablet Commonly known as: ACTOS Take 45 mg by mouth every morning.   rOPINIRole 2 MG tablet Commonly known as: REQUIP Take 2 mg by mouth at bedtime.   traZODone 100 MG tablet Commonly known as:  DESYREL Take 150 mg by mouth at bedtime as needed.   Trulicity 3 HG/9.9ME Sopn Generic drug: Dulaglutide Inject 3 mg into the skin once a week.   VITAMIN B-12 PO Take 1,000 mcg by mouth every morning.   Vitamin D (Ergocalciferol) 1.25 MG (50000 UNIT) Caps capsule Commonly known as: DRISDOL Take by mouth.   VITAMIN D3 PO Take 2,000 Units by mouth.   Vyvanse 70 MG capsule Generic drug: lisdexamfetamine Take 70 mg by mouth daily.         Past Surgical History: Past Surgical History:  Procedure Laterality Date   CESAREAN SECTION  10/1990   CHOLECYSTECTOMY  10/2000   COLONOSCOPY     NASAL SEPTUM SURGERY     07/2008 or 07/2010- pt unsure    TOTAL ABDOMINAL HYSTERECTOMY  10/2004   UPPER GASTROINTESTINAL ENDOSCOPY       Family History: Family History  Problem Relation Age of Onset   Lung cancer Mother        Smoker   Alzheimer's disease Mother        early-onset; approximately 25   Diabetes Father    Lung cancer Father        Smoker   Diabetes Sister    Allergic rhinitis Sister    Diabetes Sister    Asthma Sister    Alzheimer's disease Maternal Grandfather    Colon cancer Paternal Grandmother    Breast cancer Neg Hx    Colon polyps Neg Hx    Esophageal cancer Neg Hx    Rectal cancer Neg Hx    Stomach cancer Neg Hx      Social History: Ursala lives at home by herself in a mobile home that is 56 years old.  She does not know of any water damage or mildew in the house.  She has carpet in the family room and bedroom.  She has a  heat pump for heating and cooling.  She does have 1 cat inside the house  and outside the house there are dogs, cats, and deer but none of them are hers.  The cat does sleep in the bedroom.  There are no roaches in the house.  Her bed is not 2 feet off the floor.  She does not have plastic or dust mite free covers for the bed or pillows.  She is not exposed to smoke in the house or in the car.  She is unemployed currently she does not have  any hobbies where she is exposed to fumes, chemicals, or dust.  She does have a HEPA filter in the house.  She is a non-smoker.    Review of Systems  Constitutional:  Negative for chills and fever.  HENT:         Reports a moist nose, always having postnasal drip, and nasal congestion  Eyes:        Reports itchy watery eyes at times.  She does wear contacts  Respiratory:  Positive for cough and wheezing. Negative for shortness of breath.        Reports cough that is mainly dry, but she always has phlegm in the throat.  She also reports wheezing at times if she exerts herself.  The cough does wake her up at night.  She denies tightness in her chest and shortness of breath  Cardiovascular:  Negative for chest pain and palpitations.  Gastrointestinal:        Reports reflux symptoms.  She is currently taking pantoprazole every other day and Pepcid at night.  Genitourinary:  Negative for frequency.  Skin:  Negative for itching and rash.  Neurological:  Positive for headaches.       Objective:   Blood pressure 114/82, pulse 72, resp. rate 14, height 4' 11.4" (1.509 m), weight 176 lb 9.6 oz (80.1 kg), SpO2 96 %. Body mass index is 35.19 kg/m.   Physical Exam:   Physical Exam Constitutional:      Appearance: Normal appearance.  HENT:     Head: Normocephalic and atraumatic.     Comments: Pharynx normal, eyes normal, ears normal, nose: Bilateral lower turbinates mildly edematous and slightly erythematous with no drainage noted.  Right turbinate greater than left turbinate.    Right Ear: Tympanic membrane, ear canal and external ear normal.     Left Ear: Tympanic membrane, ear canal and external ear normal.     Mouth/Throat:     Mouth: Mucous membranes are moist.     Pharynx: Oropharynx is clear.  Eyes:     Conjunctiva/sclera: Conjunctivae normal.  Cardiovascular:     Rate and Rhythm: Normal rate and regular rhythm.     Heart sounds: Normal heart sounds.  Pulmonary:     Effort:  Pulmonary effort is normal.     Breath sounds: Normal breath sounds.     Comments: Lungs clear to auscultation Musculoskeletal:     Cervical back: Neck supple.  Skin:    General: Skin is warm.  Neurological:     Mental Status: She is alert and oriented to person, place, and time.  Psychiatric:        Mood and Affect: Mood normal.        Behavior: Behavior normal.        Thought Content: Thought content normal.        Judgment: Judgment normal.      Diagnostic studies:   Spirometry:  FVC 2.39  L (85.97%), FEV1 1.62 L (72.65%) FEV1/FVC ratio 0.68 L.  Postbronchodilator response shows FVC 2.47 L (88.85%), FEV1 1.74 L (78.03%).  There is a 7% change in FEV1.  Spirometry indicates normal spirometry.     Allergy Studies:  Percutaneous skin testing was negative with a good histamine response. Intradermal skin testing was positive to grass mix only   Airborne Adult Perc - 04/16/22 1139     Time Antigen Placed 1100    Allergen Manufacturer Greer    Location Back    Number of Test 59    Panel 1 Select    1. Control-Buffer 50% Glycerol Negative    2. Control-Histamine 1 mg/ml 3+    3. Albumin saline Negative    4. Lower [place] Negative    5. [place] Negative    6. Johnson Negative    7. [place] Blue Negative    8. Meadow Fescue Negative    9. Perennial Rye Negative    10. Sweet Vernal Negative    11. Timothy Negative    12. Cocklebur Negative    13. Burweed Marshelder Negative    14. Ragweed, short Negative    15. Ragweed, Giant Negative    16. Plantain,  English Negative    17. Lamb's Quarters Negative    18. Sheep Sorrell Negative    19. Rough Pigweed Negative    20. Marsh Elder, Rough Negative    21. Mugwort, Common Negative    22. Ash mix Negative    23. Birch mix Negative    24. Beech American Negative    25. Box, Elder Negative    26. Cedar, red Negative    27. Cottonwood, [place] Negative    28. Elm mix Negative    29. Hickory Negative    30. Maple mix Negative     31. Oak, [place] mix Negative    32. Pecan Pollen Negative    33. Pine mix Negative    34. Sycamore Eastern Negative    35. [place], Black Pollen Negative    36. Alternaria alternata Negative    37. Cladosporium Herbarum Negative    38. Aspergillus mix Negative    39. Penicillium mix Negative    40. Bipolaris sorokiniana (Helminthosporium) Negative    41. Drechslera spicifera (Curvularia) Negative    42. Mucor plumbeus Negative    43. Fusarium moniliforme Negative    44. Aureobasidium pullulans (pullulara) Negative    45. Rhizopus oryzae Negative    46. Botrytis cinera Negative    47. Epicoccum nigrum Negative    48. Phoma betae Negative    49. Candida Albicans Negative    50. Trichophyton mentagrophytes Negative    51. Mite, D Farinae  5,000 AU/ml Negative    52. Mite, D Pteronyssinus  5,000 AU/ml Negative    53. Cat Hair 10,000 BAU/ml Negative    54.  Dog Epithelia Negative    55. Mixed Feathers Negative    56. Horse Epithelia Negative    57. Cockroach, German Negative    58. Mouse Negative    59. Tobacco Leaf Negative             Intradermal - 04/16/22 1139     Time Antigen Placed 1130    Allergen Manufacturer Lavella Hammock    Location Arm    Number of Test 15    Control Negative    [place] Negative    Johnson Negative    7 Grass 2+    Ragweed mix Negative  Weed mix Negative    Tree mix Negative    Mold 1 Negative    Mold 2 Negative    Mold 3 Negative    Mold 4 Negative    Cat Negative    Dog Negative    Cockroach Negative    Mite mix Negative             Allergy testing results were read and interpreted by myself, documented by clinical staff.         Althea Charon, FNP [facility]  I have provided oversight concerning NP evaluation and treatment of this patient's health issues addressed during today's encounter. I agree with the assessment and therapeutic plan as outlined in the note.   Signed,   Jiles Prows, MD,  Allergy and Immunology,  [facility] [place].

## 2022-04-21 NOTE — Progress Notes (Signed)
PATIENT: Nancy Harris DOB: Oct 29, 1965  REASON FOR VISIT: Follow up HISTORY FROM: Patient PRIMARY NEUROLOGIST: Dr. Brett Harris   HISTORY OF PRESENT ILLNESS: Today 04/22/22 Nancy Harris is here today for follow-up. Mentions recently had spider bite, admitted for 6 days for antibiotics at [facility]. 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 Has follow up about this next week. 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 Doing virtual visit with counselor. Since off Effexor, on Cymbalta, feels better. More upbeat. Still has daytime sleepiness. Remains on Vyvanse, Adderall, from psychiatry. Stopped her CPAP for 2 months, got lazy, didn't want to clean it. Felt more tired.  She has a card download, data from 03/23/22-04/21/22 shows suboptimal usage 21/30 days at 70%, greater than 4 hours 15 days at 50%.  Average usage days used 5 hours 42 minutes.  Minimum pressure 6, maximum pressure 14 cm water.  Leak in the 95th percentile 19.3.  AHI 1.7.  Uses full facemask. ESS 18.   Update 06/27/2021 SS: Nancy Harris care today for follow-up with history of bipolar disorder, chronic insomnia, memory disturbance, and sleep apnea on CPAP.  Saw Dr. 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  Had neuropsychological testing, was largely within normal limits.  Her responses suggest she is depressed, discouraged, and withdrawal.  Cognitive dysfunction most likely related to ongoing psychiatric distress.  Not suggestive of any form of neurodegenerative illness. Had cognitive training via speech therapy. Seeing psychiatry, doesn't feel like much as changed in regards to her anxiety, depression, grief. They are adjusting her medications. Living alone. Just started a new job, Research scientist (physical sciences) at [employer], part-time. Going to try harder with time management not to be late. Using CPAP, doing well, uses most all night, no issues. Knows sleeping better, not  sure she feels much different. Headaches are much less, 1 every few weeks, will take Advil, but usually doesn't need anything. Has been taken off Topamax, on less gabapentin and Klonopin. Seeing Dr. Cheryln Harris with psychology at [facility], doing VV every few weeks. CPAP has a card.   HISTORY  11/06/2020 Dr. Jannifer Harris: Nancy Harris is a 56 year old right-handed white female with history of bipolar disorder.  The patient is followed through Nancy Harris for her depression.  The patient has been noted to have significant fatigue during the day.  She was set up for sleep evaluation at showed evidence of sleep apnea, she has not yet been placed on CPAP.  The patient has chronic insomnia, she takes melatonin around 8 PM but does not really get sleep until 1 or 2 AM.  On weekends, she may sleep until afternoon.  She has to go to work at around 11 AM, but she oftentimes shows up late.  She is now getting cognitive training through speech therapy, this is helping some.  She has had a lifelong problem with time management.  She was taken off of her Topamax which seems to have helped some with her headaches but she still has frequent early morning headaches that she wakes up with.  The patient reports a 20 pound weight gain in the last year.  She still has some ongoing chronic fatigue throughout the day but she takes the clonazepam 1 mg in the morning.  She is off of her sleeping pills at night, she has reduced the gabapentin to 300 mg twice daily.  She has had some increase in anxiety on the  lower dose of gabapentin.  The patient returns to the office today for an evaluation.  REVIEW OF SYSTEMS: Out of a complete 14 system review of symptoms, the patient complains only of the following symptoms, and all other reviewed systems are negative.  See HPI  ALLERGIES: Allergies  Allergen Reactions   Latex Itching    HOME MEDICATIONS: Outpatient Medications Prior to Visit  Medication Sig Dispense Refill   albuterol  (VENTOLIN HFA) 108 (90 Base) MCG/ACT inhaler Inhale 2 puffs every 4-6 hours as needed for cough, wheeze, tightness in the chest, or shortness of breath 8 g 1   amphetamine-dextroamphetamine (ADDERALL) 20 MG tablet Take 20 mg by mouth daily.     aspirin EC 81 MG tablet Take 81 mg by mouth at bedtime.     BIOTIN PO Take 5,000 mcg by mouth at bedtime.     brexpiprazole (REXULTI) 2 MG TABS tablet Take 2 mg by mouth daily.     CALCIUM-VITAMIN D PO Take by mouth daily.     Cholecalciferol (VITAMIN D3 PO) Take 2,000 Units by mouth.     clonazePAM (KLONOPIN) 1 MG tablet Take 1 mg by mouth daily after breakfast.     colesevelam (WELCHOL) 625 MG tablet Take 1,875 mg by mouth 2 (two) times daily with a meal.     Cyanocobalamin (VITAMIN B-12 PO) Take 1,000 mcg by mouth every morning.     DEXTROAMPHETAMINE SULFATE PO Take 20 mg by mouth daily.     Dulaglutide (TRULICITY) 3 FA/2.1HY SOPN Inject 3 mg into the skin once a week.     DULoxetine (CYMBALTA) 30 MG capsule Take 90 mg by mouth daily.     ezetimibe (ZETIA) 10 MG tablet      famotidine (PEPCID) 40 MG tablet Take 40 mg by mouth daily.     Ferrous Sulfate (IRON) 325 (65 Fe) MG TABS Take 1 tablet by mouth at bedtime.     fluticasone (FLONASE) 50 MCG/ACT nasal spray Place 1 to 2 sprays in each nostril once a day as needed for stuffy nose 16 g 5   fluticasone-salmeterol (ADVAIR HFA) 115-21 MCG/ACT inhaler Inhale 2 puffs twice a day with spacer to help prevent cough and wheeze 1 each 2   levothyroxine (SYNTHROID) 125 MCG tablet Take 125 mcg by mouth daily.     loratadine (CLARITIN) 10 MG tablet Take 10 mg by mouth daily.     metFORMIN (GLUCOPHAGE) 500 MG tablet Take 500 mg by mouth 2 (two) times daily with a meal.     metFORMIN (GLUCOPHAGE-XR) 500 MG 24 hr tablet Take 500 mg by mouth 2 (two) times daily.     metoprolol succinate (TOPROL-XL) 50 MG 24 hr tablet Take 50 mg by mouth daily after breakfast. Take with or immediately following a meal.     Omega-3  Fatty Acids (OMEGA 3 PO) Take 1,000 mg by mouth daily. After evening meal     pantoprazole (PROTONIX) 40 MG tablet Take 1 tablet (40 mg total) by mouth daily. (Patient taking differently: Take 40 mg by mouth 2 (two) times daily.) 30 tablet 11   pantoprazole (PROTONIX) 40 MG tablet TAKE 1 TABLET BY MOUTH TWICE DAILY 30 MINUTES BEFORE BREAKFAST AND DINNER 180 tablet 1   pioglitazone (ACTOS) 45 MG tablet Take 45 mg by mouth every morning.     rOPINIRole (REQUIP) 2 MG tablet Take 2 mg by mouth at bedtime.     traZODone (DESYREL) 100 MG tablet Take 150 mg by mouth at  bedtime as needed.     Vitamin D, Ergocalciferol, (DRISDOL) 1.25 MG (50000 UNIT) CAPS capsule Take by mouth.     VYVANSE 70 MG capsule Take 70 mg by mouth daily.     No facility-administered medications prior to visit.    PAST MEDICAL HISTORY: Past Medical History:  Diagnosis Date   ADD (attention deficit disorder)    Allergy    Anemia    past hx    Arthritis    Brown recluse spider bite 02/2022   required IV antibiotics   Chronic insomnia 09/03/2020   Chronic kidney disease    Chronic tension-type headache, not intractable 08/15/2015   Diabetes ([place])    Diabetes mellitus without complication    Essential tremor 11/30/2012   Excessive daytime sleepiness 09/03/2020   Generalized anxiety disorder    GERD (gastroesophageal reflux disease)    High cholesterol    History of skin cancer    Hyperlipidemia    Hypertension    Insomnia    Major depressive disorder    Neuromuscular disorder    HH, CTS, neuropathy feet and hands    Obstructive sleep apnea 11/06/2020   Osteoporosis    Paget's disease of bone 11/30/2017   Postmenopausal osteoporosis 11/30/2017   Restless leg syndrome    Thyroid disease     PAST SURGICAL HISTORY: Past Surgical History:  Procedure Laterality Date   CESAREAN SECTION  10/1990   CHOLECYSTECTOMY  10/2000   COLONOSCOPY     NASAL SEPTUM SURGERY     07/2008 or 07/2010- pt unsure    TOTAL  ABDOMINAL HYSTERECTOMY  10/2004   UPPER GASTROINTESTINAL ENDOSCOPY      FAMILY HISTORY: Family History  Problem Relation Age of Onset   Lung cancer Mother        Smoker   Alzheimer's disease Mother        early-onset; approximately 53   Diabetes Father    Lung cancer Father        Smoker   Diabetes Sister    Allergic rhinitis Sister    Diabetes Sister    Asthma Sister    Alzheimer's disease Maternal Grandfather    Colon cancer Paternal Grandmother    Breast cancer Neg Hx    Colon polyps Neg Hx    Esophageal cancer Neg Hx    Rectal cancer Neg Hx    Stomach cancer Neg Hx     SOCIAL HISTORY: Social History   Socioeconomic History   Marital status: Widowed    Spouse name: Not on file   Number of children: Not on file   Years of education: 12   Highest education level: High school graduate  Occupational History   Occupation: Scientist, water quality    Comment: Part time  Tobacco Use   Smoking status: Never   Smokeless tobacco: Never  Substance and Sexual Activity   Alcohol use: Not Currently   Drug use: Never   Sexual activity: Not on file  Other Topics Concern   Not on file  Social History Narrative   Lives alone   Right Handed   Drinks caffeine occassionally   Social Determinants of Health   Financial Resource Strain: Not on file  Food Insecurity: Not on file  Transportation Needs: Not on file  Physical Activity: Not on file  Stress: Not on file  Social Connections: Not on file  Intimate Partner Violence: Not on file   PHYSICAL EXAM  Vitals:   04/22/22 1323  BP: 128/84  Pulse: 79  Weight: 175 lb (79.4 kg)  Height: 5' (1.524 m)   Body mass index is 34.18 kg/m.  Generalized: Well developed, in no acute distress, slightly anxious Neurological examination  Mentation: Alert oriented to time, place, history taking. Follows all commands speech and language fluent, voice is shaky Cranial nerve II-XII: Pupils were equal round reactive to light. Extraocular movements  were full, visual field were full on confrontational test. Facial sensation and strength were normal. Head turning and shoulder shrug  were normal and symmetric. Motor: The motor testing reveals 5 over 5 strength of all 4 extremities. Good symmetric motor tone is noted throughout.  Sensory: Sensory testing is intact to soft touch on all 4 extremities. No evidence of extinction is noted.  Coordination: Cerebellar testing reveals good finger-nose-finger and heel-to-shin bilaterally.  No tremor was seen. Gait and station: Gait is normal.  Reflexes: Deep tendon reflexes are symmetric and normal bilaterally.   DIAGNOSTIC DATA (LABS, IMAGING, TESTING) - I reviewed patient records, labs, notes, testing and imaging myself where available.  No results found for: "WBC", "HGB", "HCT", "MCV", "PLT" No results found for: "NA", "K", "CL", "CO2", "GLUCOSE", "BUN", "CREATININE", "CALCIUM", "PROT", "ALBUMIN", "AST", "ALT", "ALKPHOS", "BILITOT", "GFRNONAA", "GFRAA" No results found for: "CHOL", "HDL", "LDLCALC", "LDLDIRECT", "TRIG", "CHOLHDL" No results found for: "HGBA1C" Lab Results  Component Value Date   VITAMINB12 1,060 07/04/2020   Lab Results  Component Value Date   TSH 0.398 (L) 07/04/2020   ASSESSMENT AND PLAN 56 y.o. year old female  has a past medical history of ADD (attention deficit disorder), Allergy, Anemia, Arthritis, Brown recluse spider bite (02/2022), Chronic insomnia (09/03/2020), Chronic kidney disease, Chronic tension-type headache, not intractable (08/15/2015), Diabetes ([place]), Diabetes mellitus without complication, Essential tremor (11/30/2012), Excessive daytime sleepiness (09/03/2020), Generalized anxiety disorder, GERD (gastroesophageal reflux disease), High cholesterol, History of skin cancer, Hyperlipidemia, Hypertension, Insomnia, Major depressive disorder, Neuromuscular disorder, Obstructive sleep apnea (11/06/2020), Osteoporosis, Paget's disease of bone (11/30/2017),  Postmenopausal osteoporosis (11/30/2017), Restless leg syndrome, and Thyroid disease. here with:  1.  Bipolar disorder, depression   2.  Sleep apnea   3.  Memory disturbance   4.  Chronic insomnia  -CPAP usage is suboptimal, discussed the importance of nightly compliance greater than 4 hours, she is motivated, clearly feels better with CPAP use -She will follow-up in 6 months for CPAP check, her machine is a card download, she lives an hour away -She will continue seeing psychiatry -Neuropsychological testing was largely within normal limits, cognitive dysfunction was felt most likely related to psychiatric distress -Dr. Brett Harris felt her persistent hypersomnia was not apnea related  Butler Denmark, AGNP-C, DNP 04/22/2022, 1:58 PM [facility] [address], [place], [place] [address] [phone]

## 2022-04-22 ENCOUNTER — Ambulatory Visit (INDEPENDENT_AMBULATORY_CARE_PROVIDER_SITE_OTHER): Admitting: Neurology

## 2022-04-22 ENCOUNTER — Encounter: Payer: Self-pay | Admitting: Neurology

## 2022-04-22 VITALS — BP 128/84 | HR 79 | Ht 60.0 in | Wt 175.0 lb

## 2022-04-22 DIAGNOSIS — G4733 Obstructive sleep apnea (adult) (pediatric): Secondary | ICD-10-CM | POA: Diagnosis not present

## 2022-04-22 NOTE — Patient Instructions (Signed)
Increase your CPAP use, using nightly for > 4 hours I will see you back in 6 months, if you bring your card by sooner I can pull a download See you back in 6 months

## 2022-04-29 DIAGNOSIS — E7800 Pure hypercholesterolemia, unspecified: Secondary | ICD-10-CM | POA: Insufficient documentation

## 2022-04-29 DIAGNOSIS — E119 Type 2 diabetes mellitus without complications: Secondary | ICD-10-CM | POA: Insufficient documentation

## 2022-04-29 DIAGNOSIS — N1831 Chronic kidney disease, stage 3a: Secondary | ICD-10-CM | POA: Insufficient documentation

## 2022-04-29 DIAGNOSIS — N2889 Other specified disorders of kidney and ureter: Secondary | ICD-10-CM | POA: Insufficient documentation

## 2022-04-29 DIAGNOSIS — B977 Papillomavirus as the cause of diseases classified elsewhere: Secondary | ICD-10-CM | POA: Insufficient documentation

## 2022-04-29 DIAGNOSIS — N3946 Mixed incontinence: Secondary | ICD-10-CM | POA: Insufficient documentation

## 2022-04-30 NOTE — Progress Notes (Signed)
Kary Kos, CMA  Reola Calkins, Reino Bellis, Drucie Opitz, Lenna Sciara; Minus Liberty New orders have been placed for the above pt, DOB: 1966-04-21  Thanks   New, Tana Felts, Verdene Lennert, CMA; Reola Calkins Ricci Barker; Minus Liberty Received, Thank you!

## 2022-05-09 ENCOUNTER — Ambulatory Visit: Admitting: Psychology

## 2022-05-12 ENCOUNTER — Ambulatory Visit: Admitting: Psychology

## 2022-05-13 ENCOUNTER — Ambulatory Visit (INDEPENDENT_AMBULATORY_CARE_PROVIDER_SITE_OTHER): Admitting: Psychology

## 2022-05-13 DIAGNOSIS — F331 Major depressive disorder, recurrent, moderate: Secondary | ICD-10-CM
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
## 2022-05-19 ENCOUNTER — Encounter: Payer: Self-pay | Admitting: Family

## 2022-05-20 DIAGNOSIS — D509 Iron deficiency anemia, unspecified: Secondary | ICD-10-CM | POA: Insufficient documentation

## 2022-05-30 HISTORY — PX: OTHER SURGICAL HISTORY: SHX169

## 2022-06-25 ENCOUNTER — Ambulatory Visit: Admitting: Allergy and Immunology

## 2022-07-01 ENCOUNTER — Ambulatory Visit: Admitting: Psychology

## 2022-07-01 DIAGNOSIS — F331 Major depressive disorder, recurrent, moderate: Secondary | ICD-10-CM
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
## 2022-07-09 ENCOUNTER — Ambulatory Visit: Admitting: Allergy and Immunology

## 2022-07-09 ENCOUNTER — Encounter: Payer: Self-pay | Admitting: Allergy and Immunology

## 2022-07-09 VITALS — BP 122/76 | HR 80 | Resp 16

## 2022-07-09 DIAGNOSIS — K219 Gastro-esophageal reflux disease without esophagitis: Secondary | ICD-10-CM | POA: Diagnosis not present

## 2022-07-09 DIAGNOSIS — J4540 Moderate persistent asthma, uncomplicated: Secondary | ICD-10-CM

## 2022-07-09 DIAGNOSIS — J3089 Other allergic rhinitis: Secondary | ICD-10-CM

## 2022-07-09 MED ORDER — FLUTICASONE-SALMETEROL 115-21 MCG/ACT IN AERO: INHALATION_SPRAY | RESPIRATORY_TRACT | 1 refills | Status: DC

## 2022-07-09 MED ORDER — FAMOTIDINE 40 MG PO TABS: ORAL_TABLET | ORAL | 1 refills | Status: DC

## 2022-07-09 MED ORDER — PANTOPRAZOLE SODIUM 40 MG PO TBEC: DELAYED_RELEASE_TABLET | ORAL | 1 refills | Status: DC

## 2022-07-09 NOTE — Patient Instructions (Addendum)
  1.  Continue Advair 115 -2 inhalations twice a day w/spacer (empty lungs)  2. Decrease Flonase to 1 spray each nostril Mon, Wed, Fri (none for 1 week)  3. Continue pantoprazole 40 mg 2 times a day + famotidine 40 mg in PM  4. If needed:   A. Albuterol HFA - 2 inhalations every 4-6 hours  B. OTC antihistamines  5. Blood - area 2 aeroallergen profile   6. Return to clinic in 12 weeks or earlier if needed  7. Further evaluation?

## 2022-07-09 NOTE — Progress Notes (Unsigned)
[facility] [place] [facility] [place]   Follow-up Note  Referring Provider: Marco Collie, MD Primary Provider: Marco Collie, MD Date of Office Visit: 07/09/2022  Subjective:   Nancy Harris (DOB: [birth year]) is a 57 y.o. female who returns to the [facility] on 07/09/2022 in re-evaluation of the following:  HPI: [name] returns to this clinic in evaluation of cough believed secondary to a component of respiratory tract inflammation and reflux induced respiratory disease.  She has less wheezing and coughing and does not need to use a short acting bronchodilator.  She still occasionally gets a intermittent deep cough.  She has had much less throat clearing and no issues with her throat.  She has not performed any exertion because of a surgery that just occurred in December.  She has had a little bit of epistaxis recently.  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  She has obtained the flu vaccine and the COVID-vaccine.  Allergies as of 07/09/2022       Reactions   Latex Itching        Medication List    albuterol 108 (90 Base) MCG/ACT inhaler Commonly known as: VENTOLIN HFA Inhale 2 puffs every 4-6 hours as needed for cough, wheeze, tightness in the chest, or shortness of breath   amphetamine-dextroamphetamine 20 MG tablet Commonly known as: ADDERALL Take 20 mg by mouth daily.   aspirin EC 81 MG tablet Take 81 mg by mouth at bedtime.   brexpiprazole 2 MG Tabs tablet Commonly known as: REXULTI Take 2 mg by mouth daily.   CALCIUM-VITAMIN D PO Take by mouth daily.   clonazePAM 1 MG tablet Commonly known as: KLONOPIN Take 1 mg by mouth daily after breakfast.   colesevelam 625 MG tablet Commonly known as: WELCHOL Take 1,875 mg by mouth 2 (two) times daily with a meal.   DEXTROAMPHETAMINE SULFATE PO Take 20  mg by mouth daily.   DULoxetine 30 MG capsule Commonly known as: CYMBALTA Take 90 mg by mouth daily.   ezetimibe 10 MG tablet Commonly known as: ZETIA   famotidine 40 MG tablet Commonly known as: PEPCID Take 40 mg by mouth daily.   fluticasone 50 MCG/ACT nasal spray Commonly known as: FLONASE Place 1 to 2 sprays in each nostril once a day as needed for stuffy nose   fluticasone-salmeterol 115-21 MCG/ACT inhaler Commonly known as: Advair HFA Inhale 2 puffs twice a day with spacer to help prevent cough and wheeze   Iron 325 (65 Fe) MG Tabs Take 1 tablet by mouth at bedtime.   levothyroxine 125 MCG tablet Commonly known as: SYNTHROID Take 125 mcg by mouth daily.   metFORMIN 500 MG 24 hr tablet Commonly known as: GLUCOPHAGE-XR Take 500 mg by mouth 2 (two) times daily.   metoprolol succinate 50 MG 24 hr tablet Commonly known as: TOPROL-XL Take 50 mg by mouth daily after breakfast. Take with or immediately following a meal.   OMEGA 3 PO Take 1,000 mg by mouth daily. After evening meal   pantoprazole 40 MG tablet Commonly known as: PROTONIX TAKE 1 TABLET BY MOUTH TWICE DAILY 30 MINUTES BEFORE BREAKFAST AND DINNER   pioglitazone 45 MG tablet Commonly known as: ACTOS Take 45 mg by mouth every morning.   rOPINIRole 2 MG tablet Commonly known as: REQUIP Take 2 mg by mouth at bedtime.  traZODone 100 MG tablet Commonly known as: DESYREL Take 150 mg by mouth at bedtime as needed.   Trulicity 3 IH/4.7QQ Sopn Generic drug: Dulaglutide Inject 3 mg into the skin once a week.   VITAMIN B-12 PO Take 1,000 mcg by mouth every morning.   Vitamin D (Ergocalciferol) 1.25 MG (50000 UNIT) Caps capsule Commonly known as: DRISDOL Take by mouth.   Vyvanse 70 MG capsule Generic drug: lisdexamfetamine Take 70 mg by mouth daily.    Past Medical History:  Diagnosis Date   ADD (attention deficit disorder)    Allergy    Anemia    past hx    Arthritis    Brown recluse spider  bite 02/2022   required IV antibiotics   Chronic insomnia 09/03/2020   Chronic kidney disease    Chronic tension-type headache, not intractable 08/15/2015   Diabetes ([place])    Diabetes mellitus without complication    Essential tremor 11/30/2012   Excessive daytime sleepiness 09/03/2020   Generalized anxiety disorder    GERD (gastroesophageal reflux disease)    High cholesterol    History of skin cancer    Hyperlipidemia    Hypertension    Insomnia    Major depressive disorder    Neuromuscular disorder    HH, CTS, neuropathy feet and hands    Obstructive sleep apnea 11/06/2020   Osteoporosis    Paget's disease of bone 11/30/2017   Postmenopausal osteoporosis 11/30/2017   Renal cell carcinoma (H[facility])    Restless leg syndrome    Thyroid disease     Past Surgical History:  Procedure Laterality Date   CESAREAN SECTION  10/1990   CHOLECYSTECTOMY  10/2000   COLONOSCOPY     NASAL SEPTUM SURGERY     07/2008 or 07/2010- pt unsure    nephrectomy Right 05/30/2022   TOTAL ABDOMINAL HYSTERECTOMY  10/2004   UPPER GASTROINTESTINAL ENDOSCOPY      Review of systems negative except as noted in HPI / PMHx or noted below:  Review of Systems  Constitutional: Negative.   HENT: Negative.    Eyes: Negative.   Respiratory: Negative.    Cardiovascular: Negative.   Gastrointestinal: Negative.   Genitourinary: Negative.   Musculoskeletal: Negative.   Skin: Negative.   Neurological: Negative.   Endo/Heme/Allergies: Negative.   Psychiatric/Behavioral: Negative.       Objective:   Vitals:   07/09/22 1101  BP: 122/76  Pulse: 80  Resp: 16  SpO2: 96%          Physical Exam Constitutional:      Appearance: She is not diaphoretic.  HENT:     Head: Normocephalic.     Right Ear: Tympanic membrane, ear canal and external ear normal.     Left Ear: Tympanic membrane, ear canal and external ear normal.     Nose: Nose normal. No mucosal edema (Punctate area of bleeding left nasal septum)  or rhinorrhea.     Mouth/Throat:     Pharynx: Uvula midline. No oropharyngeal exudate.  Eyes:     Conjunctiva/sclera: Conjunctivae normal.  Neck:     Thyroid: No thyromegaly.     Trachea: Trachea normal. No tracheal tenderness or tracheal deviation.  Cardiovascular:     Rate and Rhythm: Normal rate and regular rhythm.     Heart sounds: Normal heart sounds, S1 normal and S2 normal. No murmur heard. Pulmonary:     Effort: No respiratory distress.     Breath sounds: Normal breath sounds. No stridor. No wheezing or rales.  Lymphadenopathy:     Head:     Right side of head: No tonsillar adenopathy.     Left side of head: No tonsillar adenopathy.     Cervical: No cervical adenopathy.  Skin:    Findings: No erythema or rash.     Nails: There is no clubbing.  Neurological:     Mental Status: She is alert.     Diagnostics:    Spirometry was not performed secondary to her recent surgery.  Assessment and Plan:   1. Asthma, moderate persistent, well-controlled   2. Perennial allergic rhinitis   3. LPRD (laryngopharyngeal reflux disease)     1.  Continue Advair 115 -2 inhalations twice a day w/spacer (empty lungs)  2. Decrease Flonase to 1 spray each nostril Mon, Wed, Fri (none for 1 week)  3. Continue pantoprazole 40 mg 2 times a day + famotidine 40 mg in PM  4. If needed:   A. Albuterol HFA - 2 inhalations every 4-6 hours  B. OTC antihistamines  5. Blood - area 2 aeroallergen profile   6. Return to clinic in 12 weeks or earlier if needed  7. Further evaluation?  I am going to keep him on anti-inflammatory agents for airway and have her continue to aggressively treat reflux/LPR at this point and further evaluate her atopic status by checking an area to aero allergen profile.  She will need to decrease her use of Flonase as she is developing some degree of irritation of her nasal airway with the usual dose of Flonase.  I will regroup with her in 12 weeks.  If she continues  to show improvement there may be an opportunity to consolidate her medical treatment.  Allena Katz, MD Allergy / Immunology [place]

## 2022-07-10 ENCOUNTER — Encounter: Payer: Self-pay | Admitting: Allergy and Immunology

## 2022-07-11 ENCOUNTER — Encounter: Payer: Self-pay | Admitting: Neurology

## 2022-07-12 LAB — ALLERGENS W/TOTAL IGE AREA 2
Alternaria Alternata IgE: <0.1 kU/L
Aspergillus Fumigatus IgE: <0.1 kU/L
Bermuda Grass IgE: <0.1 kU/L
Cat Dander IgE: <0.1 kU/L
Cedar, Mountain IgE: <0.1 kU/L
Cladosporium Herbarum IgE: <0.1 kU/L
Cockroach, German IgE: <0.1 kU/L
Common Silver Birch IgE: <0.1 kU/L
Cottonwood IgE: <0.1 kU/L
D Farinae IgE: <0.1 kU/L
D Pteronyssinus IgE: <0.1 kU/L
Dog Dander IgE: <0.1 kU/L
Elm, American IgE: <0.1 kU/L
IgE (Immunoglobulin E), Serum: 47 IU/mL (ref 6–495)
Johnson Grass IgE: <0.1 kU/L
Maple/Box Elder IgE: <0.1 kU/L
Mouse Urine IgE: <0.1 kU/L
Oak, White IgE: <0.1 kU/L
Pecan, Hickory IgE: <0.1 kU/L
Penicillium Chrysogen IgE: <0.1 kU/L
Pigweed, Rough IgE: <0.1 kU/L
Ragweed, Short IgE: <0.1 kU/L
Sheep Sorrel IgE Qn: <0.1 kU/L
Timothy Grass IgE: 0.61 kU/L — AB
White Mulberry IgE: <0.1 kU/L

## 2022-08-05 ENCOUNTER — Ambulatory Visit: Admitting: Psychology

## 2022-08-05 DIAGNOSIS — F331 Major depressive disorder, recurrent, moderate: Secondary | ICD-10-CM

## 2022-08-05 NOTE — Progress Notes (Signed)
                                                             Nancy Harris is a 57 y.o. female patient  Date: 08/05/2022   Diagnosis Bipolar,  complicated grief, depression Symptoms unspecified Medication Status compliance  Safety none  If Suicidal or Homicidal State Action Taken: unspecified  Current Risk: low Medications See chart Objectives unspecified Client Response full compliance  Service Location Location, [address] [place], [place] [address]  Service Code cpt [id number]  Facilitate problem solving  Normalize/Reframe  Relaxation training  Self-monitoring  Self care activities  Emotion regulation skills  Validate/empathize  Behavioral activation plan  Session notes:  Dx. Bipolar 1,  and Complicated Grief  Meds:Cymbalta ('30mg'$  then increase to '90mg'$ ), Clonazepam '1mg'$ , Wellcall '625mg'$  (3, twice daily with food), dextroamphetamine '20mg'$  (1 in afternoon), Trulicity (1 injection/week), Zetia '10mg'$ , Gabapentin '300mg'$  at bedtime, Metforman '500mg'$  twice daily, Toprol XL '50mg'$  in morning, Protonix '40mg'$  in evening, Actos 45 mg in morning, Rexulti 2 mg in morning, Trazedone '200mg'$ , Vit. D2, Vivance '70mg'$  every morning, B12 and baby aspirin.  Goals: Patient states that she is seeking counseling to resolve depressive moods and manage complicated, unresolved grief. Goal date 12-24 Patient states that she is willing to have a video session. She is at home and I am in my home office.  Patient states she still has not heard from the kidney doctor about a follow-up post-surgery appointment. Has been feeling okay physically, but got sick last Friday for a few days.  Work is going along okay. 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 She is not getting enough hours at work and is frustrated. She does say that, with the exception of her recent illness, her moods have been very good and stable.  Her sleep is, again,  off track. She is sleeping very few hours. She is able to get to sleep, but wakes up extremely early and cannot get back to sleep. The Trazodone is helpful in initiating sleep but not sustaining it.  HW: Call kidney doc to get on schedule. Call regarding meds to emphasize she is without her medication. Call regarding food stamps to request an increase.                              `                 Aveah Castell LEWIS Time: 11:40a- 12:30p 50 min.

## 2022-09-02 ENCOUNTER — Ambulatory Visit: Admitting: Psychology

## 2022-09-02 DIAGNOSIS — F331 Major depressive disorder, recurrent, moderate: Secondary | ICD-10-CM

## 2022-09-02 NOTE — Progress Notes (Signed)
                                                                            Nancy Harris is a 57 y.o. female patient  Date: 09/02/2022   Diagnosis Bipolar,  complicated grief, depression Symptoms unspecified Medication Status compliance  Safety none  If Suicidal or Homicidal State Action Taken: unspecified  Current Risk: low Medications See chart Objectives unspecified Client Response full compliance  Service Location Location, [address] [place], [place] [address]  Service Code cpt [phone]  Facilitate problem solving  Normalize/Reframe  Relaxation training  Self-monitoring  Self care activities  Emotion regulation skills  Validate/empathize  Behavioral activation plan  Session notes:  Dx. Bipolar 1,  and Complicated Grief  Meds:Cymbalta ('30mg'$  then increase to '90mg'$ ), Clonazepam '1mg'$ , Wellcall '625mg'$  (3, twice daily with food), dextroamphetamine '20mg'$  (1 in afternoon), Trulicity (1 injection/week), Zetia '10mg'$ , Gabapentin '300mg'$  at bedtime, Metforman '500mg'$  twice daily, Toprol XL '50mg'$  in morning, Protonix '40mg'$  in evening, Actos 45 mg in morning, Rexulti 2 mg in morning, Trazedone '200mg'$ , Vit. D2, Vivance '70mg'$  every morning, B12 and baby aspirin.  Goals: Patient states that she is seeking counseling to resolve depressive moods and manage complicated, unresolved grief. Goal date 12-24 Patient states that she is willing to have a video session. She is at home and I am in my home office.  Patient says her job with [employer] did not work out. She is applying for other jobs (office work). She is re-applying for her food stamps and hopes to get an increase. She says she has been "putting up a front" and pretending to be okay. She has been seeing her siblings. In reality, she is feeling down and has little to no motivation. Her visits with sibs has ben helpful. Her medicine (Vivance) has not been re-filled. The  prescription was changed, but it is also on back order. It is up to her to locate the medicine. It has now been 1 month without her medication.   Nancy Harris Time: 12:10p- 1:00p 50 min.

## 2022-10-22 ENCOUNTER — Encounter: Payer: Self-pay | Admitting: Allergy and Immunology

## 2022-10-22 ENCOUNTER — Ambulatory Visit (INDEPENDENT_AMBULATORY_CARE_PROVIDER_SITE_OTHER): Admitting: Allergy and Immunology

## 2022-10-22 VITALS — BP 124/72 | HR 87 | Resp 16

## 2022-10-22 DIAGNOSIS — J4540 Moderate persistent asthma, uncomplicated: Secondary | ICD-10-CM | POA: Diagnosis not present

## 2022-10-22 DIAGNOSIS — J3089 Other allergic rhinitis: Secondary | ICD-10-CM

## 2022-10-22 DIAGNOSIS — J340 Abscess, furuncle and carbuncle of nose: Secondary | ICD-10-CM | POA: Diagnosis not present

## 2022-10-22 DIAGNOSIS — K219 Gastro-esophageal reflux disease without esophagitis: Secondary | ICD-10-CM | POA: Diagnosis not present

## 2022-10-22 MED ORDER — MUPIROCIN 2 % EX OINT: TOPICAL_OINTMENT | CUTANEOUS | 1 refills | Status: DC

## 2022-10-22 NOTE — Patient Instructions (Addendum)
  1.  Continue Advair 115 -2 inhalations twice a day w/spacer (empty lungs)  2. No nasal steroids at this point in time  3. Use nasal saline , then Bactroban inside nose 3 times a day for 10 days  3. Treat reflux with the following:  A. Pantoprazole 40 mg 2 times a day + famotidine 40 mg in PM B. Attempt to decrease size of evening meal and try to eat earlier in evening C. Nissen fundoplication???  4. If needed:   A. Albuterol HFA - 2 inhalations every 4-6 hours  B. OTC antihistamines  5. Return to clinic in 12 weeks or earlier if needed

## 2022-10-22 NOTE — Progress Notes (Unsigned)
[facility] [place] [facility] [place] [facility] [place]   Follow-up Note  Referring Provider: Abner Greenspan, MD Primary Provider: Abner Greenspan, MD Date of Office Visit: 10/22/2022  Subjective:   Nancy Harris (DOB: May 24, 1966) is a 57 y.o. female who [facility] in re-evaluation of the following:  HPI: Nancy Harris returns to this clinic in evaluation of cough with a component of respiratory tract inflammation reflux induced respiratory disease.  I last saw her in this clinic 09 July 2022.  She has been doing okay but after she lays down at nighttime she does have a lot of throat clearing and some cough.  She is definitely been having regurgitation.  She eats around 10 PM at night and she lays down around 1 AM.  Her evening meal is the largest meal of the day.  She drinks tea about 1-2 times per week but no other sources of caffeine.  She has not seen Dr. Chales Abrahams in many years.  She uses Flonase but she is getting some nosebleeds so she had to back off from using the nasal spray.  She has been using Advair consistently.  Rarely does she use any albuterol.  Sometimes she will try albuterol with her evening cough but it never appears to help that issue.  Allergies as of 10/22/2022       Reactions   Latex Itching        Medication List    albuterol 108 (90 Base) MCG/ACT inhaler Commonly known as: VENTOLIN HFA Inhale 2 puffs every 4-6 hours as needed for cough, wheeze, tightness in the chest, or shortness of breath   amphetamine-dextroamphetamine 20 MG tablet Commonly known as: ADDERALL Take 20 mg by mouth daily.   aspirin EC 81 MG tablet Take 81 mg by mouth at bedtime.   brexpiprazole 2 MG Tabs tablet Commonly known as: REXULTI Take 2 mg by mouth daily.   CALCIUM-VITAMIN D PO Take by mouth daily.   clonazePAM 1 MG tablet Commonly known as: KLONOPIN Take 1 mg by mouth daily after breakfast.   colesevelam 625 MG  tablet Commonly known as: WELCHOL Take 1,875 mg by mouth 2 (two) times daily with a meal.   DEXTROAMPHETAMINE SULFATE PO Take 20 mg by mouth daily.   DULoxetine 30 MG capsule Commonly known as: CYMBALTA Take 90 mg by mouth daily.   ezetimibe 10 MG tablet Commonly known as: ZETIA   famotidine 40 MG tablet Commonly known as: PEPCID Take one tablet by mouth at bedtime.   fluticasone 50 MCG/ACT nasal spray Commonly known as: FLONASE Place 1 to 2 sprays in each nostril once a day as needed for stuffy nose   fluticasone-salmeterol 115-21 MCG/ACT inhaler Commonly known as: Advair HFA Inhale 2 puffs twice a day with spacer to help prevent cough and wheeze.  Rinse, gargle, and spit after use.   Iron 325 (65 Fe) MG Tabs Take 1 tablet by mouth at bedtime.   levothyroxine 125 MCG tablet Commonly known as: SYNTHROID Take 125 mcg by mouth daily.   metFORMIN 500 MG 24 hr tablet Commonly known as: GLUCOPHAGE-XR Take 500 mg by mouth 2 (two) times daily.   Methylphenidate HCl ER (OSM) 72 MG Tbcr   metoprolol succinate 50 MG 24 hr tablet Commonly known as: TOPROL-XL Take 50 mg by mouth daily after breakfast. Take with or immediately following a meal.   OMEGA 3 PO Take 1,000 mg by mouth daily. After evening meal  pantoprazole 40 MG tablet Commonly known as: PROTONIX TAKE 1 TABLET BY MOUTH TWICE DAILY AS DIRECTED.   pioglitazone 45 MG tablet Commonly known as: ACTOS Take 45 mg by mouth every morning.   rOPINIRole 2 MG tablet Commonly known as: REQUIP Take 2 mg by mouth at bedtime.   traZODone 100 MG tablet Commonly known as: DESYREL Take 150 mg by mouth at bedtime as needed.   Trulicity 3 MG/0.5ML Sopn Generic drug: Dulaglutide Inject 3 mg into the skin once a week.   VITAMIN B-12 PO Take 1,000 mcg by mouth every morning.   Vitamin D (Ergocalciferol) 1.25 MG (50000 UNIT) Caps capsule Commonly known as: DRISDOL Take by mouth.    Past Medical History:  Diagnosis  Date   ADD (attention deficit disorder)    Allergy    Anemia    past hx    Arthritis    Brown recluse spider bite 02/2022   required IV antibiotics   Chronic insomnia 09/03/2020   Chronic kidney disease    Chronic tension-type headache, not intractable 08/15/2015   Diabetes    Diabetes mellitus without complication    Essential tremor 11/30/2012   Excessive daytime sleepiness 09/03/2020   Generalized anxiety disorder    GERD (gastroesophageal reflux disease)    High cholesterol    History of skin cancer    Hyperlipidemia    Hypertension    Insomnia    Major depressive disorder    Neuromuscular disorder    HH, CTS, neuropathy feet and hands    Obstructive sleep apnea 11/06/2020   Osteoporosis    Paget's disease of bone 11/30/2017   Postmenopausal osteoporosis 11/30/2017   Renal cell carcinoma    Restless leg syndrome    Thyroid disease     Past Surgical History:  Procedure Laterality Date   CESAREAN SECTION  10/1990   CHOLECYSTECTOMY  10/2000   COLONOSCOPY     NASAL SEPTUM SURGERY     07/2008 or 07/2010- pt unsure    nephrectomy Right 05/30/2022   TOTAL ABDOMINAL HYSTERECTOMY  10/2004   UPPER GASTROINTESTINAL ENDOSCOPY      Review of systems negative except as noted in HPI / PMHx or noted below:  Review of Systems  Constitutional: Negative.   HENT: Negative.    Eyes: Negative.   Respiratory: Negative.    Cardiovascular: Negative.   Gastrointestinal: Negative.   Genitourinary: Negative.   Musculoskeletal: Negative.   Skin: Negative.   Neurological: Negative.   Endo/Heme/Allergies: Negative.   Psychiatric/Behavioral: Negative.       Objective:   Vitals:   10/22/22 1635  BP: 124/72  Pulse: 87  Resp: 16  SpO2: 98%          Physical Exam Constitutional:      Appearance: She is not diaphoretic.  HENT:     Head: Normocephalic.     Right Ear: Tympanic membrane, ear canal and external ear normal.     Left Ear: Tympanic membrane, ear canal and  external ear normal.     Nose: Nose normal. No mucosal edema (Erythematous excoriated bleeding septal wall left greater then right) or rhinorrhea.     Mouth/Throat:     Pharynx: Uvula midline. No oropharyngeal exudate.  Eyes:     Conjunctiva/sclera: Conjunctivae normal.  Neck:     Thyroid: No thyromegaly.     Trachea: Trachea normal. No tracheal tenderness or tracheal deviation.  Cardiovascular:     Rate and Rhythm: Normal rate and regular rhythm.  Heart sounds: Normal heart sounds, S1 normal and S2 normal. No murmur heard. Pulmonary:     Effort: No respiratory distress.     Breath sounds: Normal breath sounds. No stridor. No wheezing or rales.  Lymphadenopathy:     Head:     Right side of head: No tonsillar adenopathy.     Left side of head: No tonsillar adenopathy.     Cervical: No cervical adenopathy.  Skin:    Findings: No erythema or rash.     Nails: There is no clubbing.  Neurological:     Mental Status: She is alert.     Diagnostics:    Spirometry was performed and demonstrated an FEV1 of 1.86 at 84 % of predicted.   Results of blood tests obtained 09 July 2022 identified IgE 47 KU/L, Timothy grass IgE at 0.61 KU/L, no other antigens specific IgE antibodies on an area 2 aero allergen profile.  Assessment and Plan:   1. Asthma, moderate persistent, well-controlled   2. Perennial allergic rhinitis   3. LPRD (laryngopharyngeal reflux disease)   4. Nasal septum ulceration     1.  Continue Advair 115 -2 inhalations twice a day w/spacer (empty lungs)  2. No nasal steroids at this point in time  3. Use nasal saline , then Bactroban inside nose 3 times a day for 10 days  3. Treat reflux with the following:  A. Pantoprazole 40 mg 2 times a day + famotidine 40 mg in PM B. Attempt to decrease size of evening meal and try to eat earlier in evening C. Nissen fundoplication???  4. If needed:   A. Albuterol HFA - 2 inhalations every 4-6 hours  B. OTC  antihistamines  5. Return to clinic in 12 weeks or earlier if needed  I think Nancy Harris still has some problems with reflux.  She apparently has very active reflux and I given her some suggestions about how to decrease the activity of this condition and I have given her the name of the Nissen fundoplication for her to explore as a possible option to deal with her reflux.  She has developed some significant problems while using Flonase with her nasal mucosa and will need to eliminate her nasal steroids and I have given her some Bactroban to help her clear up that issue.  She will continue to use Advair on a consistent basis.  I will see her back in clinic in 12 weeks or earlier if there is a problem.  Laurette Schimke, MD Allergy / Immunology [facility]

## 2022-10-23 ENCOUNTER — Encounter: Payer: Self-pay | Admitting: Allergy and Immunology

## 2022-10-30 ENCOUNTER — Ambulatory Visit: Admitting: Psychology

## 2022-10-30 ENCOUNTER — Telehealth: Payer: Self-pay

## 2022-10-30 ENCOUNTER — Ambulatory Visit: Admitting: Neurology

## 2022-10-30 VITALS — BP 118/85 | HR 100 | Ht 59.0 in | Wt 164.0 lb

## 2022-10-30 DIAGNOSIS — G4733 Obstructive sleep apnea (adult) (pediatric): Secondary | ICD-10-CM | POA: Diagnosis not present

## 2022-10-30 DIAGNOSIS — F331 Major depressive disorder, recurrent, moderate: Secondary | ICD-10-CM | POA: Diagnosis not present

## 2022-10-30 NOTE — Progress Notes (Signed)
PATIENT: Nancy Harris DOB: 06-13-1966  REASON FOR VISIT: Follow up HISTORY FROM: Patient PRIMARY NEUROLOGIST: Dr. Vickey Huger   HISTORY OF PRESENT ILLNESS: Today 10/30/22 CPAP data from 08/02/22-10/30/22 29/90 days 32% > 4 hours 17 days 19%. 6-12 cm water, EPR 2. AHI 3.0. Leak 14.2. CPAP use is poor, is not sleeping well. She takes it off if she can't fall asleep. Using nasal pillow mask. Has been diagnosed with depression. Sees therapist. Takes trazodone for sleep. March 2022, sleep study showed moderate severe OSA 23.8/hour. ESS 17. Never started her work at [employer]. Interview next week to work at [employer]  Also on Adderall, Rexulti, Klonopin, Cymbalta, Ritalin, Requip, trazodone.  04/22/22 SS: [name] is here today for follow-up. Mentions recently had spider bite, admitted for 6 days for antibiotics at [place]. 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 Has follow up about this next week. 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 Doing virtual visit with counselor. Since off Effexor, on Cymbalta, feels better. More upbeat. Still has daytime sleepiness. Remains on Vyvanse, Adderall, from psychiatry. Stopped her CPAP for 2 months, got lazy, didn't want to clean it. Felt more tired.  She has a card download, data from 03/23/22-04/21/22 shows suboptimal usage 21/30 days at 70%, greater than 4 hours 15 days at 50%.  Average usage days used 5 hours 42 minutes.  Minimum pressure 6, maximum pressure 14 cm water.  Leak in the 95th percentile 19.3.  AHI 1.7.  Uses full facemask. ESS 18.   Update 06/27/2021 SS: Trinda Pascal care today for follow-up with history of bipolar disorder, chronic insomnia, memory disturbance, and sleep apnea on CPAP.  Saw Dr. 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  Had neuropsychological testing, was largely within normal limits.  Her responses suggest she is depressed, discouraged, and withdrawal.   Cognitive dysfunction most likely related to ongoing psychiatric distress.  Not suggestive of any form of neurodegenerative illness. Had cognitive training via speech therapy. Seeing psychiatry, doesn't feel like much as changed in regards to her anxiety, depression, grief. They are adjusting her medications. Living alone. 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 Going to try harder with time management not to be late. Using CPAP, doing well, uses most all night, no issues. Knows sleeping better, not sure she feels much different. Headaches are much less, 1 every few weeks, will take Advil, but usually doesn't need anything. Has been taken off Topamax, on less gabapentin and Klonopin. Seeing Dr. Dellia Cloud with psychology at [facility], doing VV every few weeks. CPAP has a card.   HISTORY  11/06/2020 Dr. Anne Hahn: Ms. Nancy Harris is a 57 year old right-handed white female with history of bipolar disorder.  The patient is followed through [facility] for her depression.  The patient has been noted to have significant fatigue during the day.  She was set up for sleep evaluation at showed evidence of sleep apnea, she has not yet been placed on CPAP.  The patient has chronic insomnia, she takes melatonin around 8 PM but does not really get sleep until 1 or 2 AM.  On weekends, she may sleep until afternoon.  She has to go to work at around 11 AM, but she oftentimes shows up late.  She is now getting cognitive training through speech therapy, this is helping some.  She has had a lifelong problem with time management.  She was taken off of her Topamax which seems to have helped some with her headaches but she still has frequent early morning headaches that she wakes up with.  The patient reports a 20 pound weight gain in the last year.  She still has some ongoing chronic fatigue throughout the day but she takes the clonazepam 1 mg in the morning.  She is off of her sleeping pills at night, she  has reduced the gabapentin to 300 mg twice daily.  She has had some increase in anxiety on the lower dose of gabapentin.  The patient returns to the office today for an evaluation.  REVIEW OF SYSTEMS: Out of a complete 14 system review of symptoms, the patient complains only of the following symptoms, and all other reviewed systems are negative.  See HPI  ALLERGIES: Allergies  Allergen Reactions   Latex Itching    HOME MEDICATIONS: Outpatient Medications Prior to Visit  Medication Sig Dispense Refill   albuterol (VENTOLIN HFA) 108 (90 Base) MCG/ACT inhaler Inhale 2 puffs every 4-6 hours as needed for cough, wheeze, tightness in the chest, or shortness of breath 8 g 1   amphetamine-dextroamphetamine (ADDERALL) 20 MG tablet Take 20 mg by mouth daily.     aspirin EC 81 MG tablet Take 81 mg by mouth at bedtime.     brexpiprazole (REXULTI) 2 MG TABS tablet Take 2 mg by mouth daily.     CALCIUM-VITAMIN D PO Take by mouth daily.     clonazePAM (KLONOPIN) 1 MG tablet Take 1 mg by mouth daily after breakfast.     colesevelam (WELCHOL) 625 MG tablet Take 1,875 mg by mouth 2 (two) times daily with a meal.     Cyanocobalamin (VITAMIN B-12 PO) Take 1,000 mcg by mouth every morning.     DEXTROAMPHETAMINE SULFATE PO Take 20 mg by mouth daily.     Dulaglutide (TRULICITY) 3 MG/0.5ML SOPN Inject 3 mg into the skin once a week.     DULoxetine (CYMBALTA) 30 MG capsule Take 90 mg by mouth daily.     ezetimibe (ZETIA) 10 MG tablet      famotidine (PEPCID) 40 MG tablet Take one tablet by mouth at bedtime. 90 tablet 1   Ferrous Sulfate (IRON) 325 (65 Fe) MG TABS Take 1 tablet by mouth at bedtime.     fluticasone-salmeterol (ADVAIR HFA) 115-21 MCG/ACT inhaler Inhale 2 puffs twice a day with spacer to help prevent cough and wheeze.  Rinse, gargle, and spit after use. 3 each 1   levothyroxine (SYNTHROID) 125 MCG tablet Take 125 mcg by mouth daily.     metFORMIN (GLUCOPHAGE-XR) 500 MG 24 hr tablet Take 500 mg by  mouth 2 (two) times daily.     Methylphenidate HCl ER, OSM, 72 MG TBCR      metoprolol succinate (TOPROL-XL) 50 MG 24 hr tablet Take 50 mg by mouth daily after breakfast. Take with or immediately following a meal.     mupirocin ointment (BACTROBAN) 2 % Apply inside the nose 3 times a day for 10 days 30 g 1   Omega-3 Fatty Acids (OMEGA 3 PO) Take 1,000 mg by mouth daily. After evening meal     pantoprazole (PROTONIX) 40 MG tablet TAKE 1 TABLET BY MOUTH TWICE DAILY AS DIRECTED. 180 tablet 1   pioglitazone (ACTOS) 45 MG tablet Take 45 mg by mouth every morning.     rOPINIRole (REQUIP) 2 MG tablet Take 2 mg by mouth at bedtime.  traZODone (DESYREL) 100 MG tablet Take 150 mg by mouth at bedtime as needed.     Vitamin D, Ergocalciferol, (DRISDOL) 1.25 MG (50000 UNIT) CAPS capsule Take by mouth.     fluticasone (FLONASE) 50 MCG/ACT nasal spray Place 1 to 2 sprays in each nostril once a day as needed for stuffy nose (Patient not taking: Reported on 10/30/2022) 16 g 5   No facility-administered medications prior to visit.    PAST MEDICAL HISTORY: Past Medical History:  Diagnosis Date   ADD (attention deficit disorder)    Allergy    Anemia    past hx    Arthritis    Brown recluse spider bite 02/2022   required IV antibiotics   Chronic insomnia 09/03/2020   Chronic kidney disease    Chronic tension-type headache, not intractable 08/15/2015   Diabetes (HCC)    Diabetes mellitus without complication    Essential tremor 11/30/2012   Excessive daytime sleepiness 09/03/2020   Generalized anxiety disorder    GERD (gastroesophageal reflux disease)    High cholesterol    History of skin cancer    Hyperlipidemia    Hypertension    Insomnia    Major depressive disorder    Neuromuscular disorder    HH, CTS, neuropathy feet and hands    Obstructive sleep apnea 11/06/2020   Osteoporosis    Paget's disease of bone 11/30/2017   Postmenopausal osteoporosis 11/30/2017   Renal cell carcinoma (HCC)     Restless leg syndrome    Thyroid disease     PAST SURGICAL HISTORY: Past Surgical History:  Procedure Laterality Date   CESAREAN SECTION  10/1990   CHOLECYSTECTOMY  10/2000   COLONOSCOPY     NASAL SEPTUM SURGERY     07/2008 or 07/2010- pt unsure    nephrectomy Right 05/30/2022   TOTAL ABDOMINAL HYSTERECTOMY  10/2004   UPPER GASTROINTESTINAL ENDOSCOPY      FAMILY HISTORY: Family History  Problem Relation Age of Onset   Lung cancer Mother        Smoker   Alzheimer's disease Mother        early-onset; approximately 55   Diabetes Father    Lung cancer Father        Smoker   Diabetes Sister    Allergic rhinitis Sister    Diabetes Sister    Asthma Sister    Alzheimer's disease Maternal Grandfather    Colon cancer Paternal Grandmother    Breast cancer Neg Hx    Colon polyps Neg Hx    Esophageal cancer Neg Hx    Rectal cancer Neg Hx    Stomach cancer Neg Hx     SOCIAL HISTORY: Social History   Socioeconomic History   Marital status: Widowed    Spouse name: Not on file   Number of children: Not on file   Years of education: 12   Highest education level: High school graduate  Occupational History   Occupation: Conservation officer, nature    Comment: Part time  Tobacco Use   Smoking status: Never   Smokeless tobacco: Never  Substance and Sexual Activity   Alcohol use: Not Currently   Drug use: Never   Sexual activity: Not on file  Other Topics Concern   Not on file  Social History Narrative   Lives alone   Right Handed   Drinks caffeine occassionally   Social Determinants of Health   Financial Resource Strain: Not on file  Food Insecurity: Not on file  Transportation Needs: Not on  file  Physical Activity: Not on file  Stress: Not on file  Social Connections: Not on file  Intimate Partner Violence: Not on file   PHYSICAL EXAM  Vitals:   10/30/22 1515  BP: 118/85  Pulse: 100  Weight: 164 lb (74.4 kg)  Height: 4\' 11"  (1.499 m)    Body mass index is 33.12  kg/m.  Generalized: Well developed, in no acute distress, well-dressed, appearing Neurological examination  Mentation: Alert oriented to time, place, history taking. Follows all commands speech and language fluent Cranial nerve II-XII: Pupils were equal round reactive to light. Extraocular movements were full, visual field were full on confrontational test. Facial sensation and strength were normal. Head turning and shoulder shrug  were normal and symmetric. Motor: The motor testing reveals 5 over 5 strength of all 4 extremities. Good symmetric motor tone is noted throughout.  Sensory: Sensory testing is intact to soft touch on all 4 extremities. No evidence of extinction is noted.  Coordination: Cerebellar testing reveals good finger-nose-finger and heel-to-shin bilaterally.  No tremor was seen. Gait and station: Gait is normal.  Reflexes: Deep tendon reflexes are symmetric and normal bilaterally.   DIAGNOSTIC DATA (LABS, IMAGING, TESTING) - I reviewed patient records, labs, notes, testing and imaging myself where available.  No results found for: "WBC", "HGB", "HCT", "MCV", "PLT" No results found for: "NA", "K", "CL", "CO2", "GLUCOSE", "BUN", "CREATININE", "CALCIUM", "PROT", "ALBUMIN", "AST", "ALT", "ALKPHOS", "BILITOT", "GFRNONAA", "GFRAA" No results found for: "CHOL", "HDL", "LDLCALC", "LDLDIRECT", "TRIG", "CHOLHDL" No results found for: "HGBA1C" Lab Results  Component Value Date   VITAMINB12 1,060 07/04/2020   Lab Results  Component Value Date   TSH 0.398 (L) 07/04/2020   ASSESSMENT AND PLAN 57 y.o. year old female  has a past medical history of ADD (attention deficit disorder), Allergy, Anemia, Arthritis, Brown recluse spider bite (02/2022), Chronic insomnia (09/03/2020), Chronic kidney disease, Chronic tension-type headache, not intractable (08/15/2015), Diabetes (HCC), Diabetes mellitus without complication, Essential tremor (11/30/2012), Excessive daytime sleepiness  (09/03/2020), Generalized anxiety disorder, GERD (gastroesophageal reflux disease), High cholesterol, History of skin cancer, Hyperlipidemia, Hypertension, Insomnia, Major depressive disorder, Neuromuscular disorder, Obstructive sleep apnea (11/06/2020), Osteoporosis, 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 here with:  1.  Bipolar disorder, depression   2.  Sleep apnea   3.  Memory disturbance   4.  Chronic insomnia  -CPAP usage continues to be suboptimal, again we have discussed using nightly for a minimum of 4 hours -She seems motivated to improve CPAP usage -She is on multiple stimulants and mood managing medications -PSG in March 2022 showed moderate severe OSA with AHI 23.8/hour -Neuropsychological testing was largely within normal limits, cognitive dysfunction was felt most likely related to psychiatric distress -Dr. Vickey Huger felt her persistent hypersomnia was not apnea related -Follow-up in 6 months for CPAP  Margie Ege, AGNP-C, DNP 10/30/2022, 3:28 PM [facility] [address] [place], [place] [address] [phone]

## 2022-10-30 NOTE — Patient Instructions (Signed)
Try to get back to using CPAP nightly for minimum of 4 hours, need to improve compliance

## 2022-10-30 NOTE — Telephone Encounter (Signed)
-----   Message from Glean Salvo, NP sent at 10/30/2022  4:03 PM EDT ----- Please send order to DME to send supplies as needed.  Thanks, Maralyn Sago

## 2022-10-30 NOTE — Telephone Encounter (Signed)
New orders placed by community message via epic

## 2022-10-30 NOTE — Progress Notes (Signed)
  Nancy Harris is a 57 y.o. female patient  Date: 10/30/2022   Diagnosis Bipolar,  complicated grief, depression Symptoms unspecified Medication Status compliance  Safety none  If Suicidal or Homicidal State Action Taken: unspecified  Current Risk: low Medications See chart Objectives unspecified Client Response full compliance  Service Location Location, [address] [place], [place] [address]  Service Code cpt [phone]  Facilitate problem solving  Normalize/Reframe  Relaxation training  Self-monitoring  Self care activities  Emotion regulation skills  Validate/empathize  Behavioral activation plan  Session notes:  Dx. Bipolar 1,  and Complicated Grief  Meds:Cymbalta (30mg  then increase to 90mg ), Clonazepam 1mg , Wellcall 625mg  (3, twice daily with food), dextroamphetamine 20mg  (1 in afternoon), Trulicity (1 injection/week), Zetia 10mg , Gabapentin 300mg  at bedtime, Metforman 500mg  twice daily, Toprol XL 50mg  in morning, Protonix 40mg  in evening, Actos 45 mg in morning, Rexulti 2 mg in morning, Trazedone 200mg , Vit. D2, Vivance 70mg  every morning, B12 and baby aspirin.  Goals: Patient states that she is seeking counseling to resolve depressive moods and manage complicated, unresolved grief. Goal date 12-24 Patient states that she is willing to have a video session. She is at home and I am in my home office.  Patient says she is "not great". She has been depressed and sleeping a lot. She has an interview on Tuesday and is hopeful because it was a place she had worked in the past. It is the same [employer] she worked at in the past. She feels that if she gets a job, it will really help her depression. Feels that much of her depression is related to her financial stress. She is barely meeting expenses. She says her medicine regimen is confusing and difficult to maintain. She sometimes falls asleep and misses taking the meds at the appropriate time. Encouraged her  to discuss medicines with her provider, who she will see later this month.  She states that in addition to her depression she is having some "disturbing" dreams. She wakes up upset. Thinking it may be related to her medicine. Discussed the need to have tasks lined up every day with a timeline for completion. She needs to be up every day, dressed and out of bed. She agrees and will work on preparing a "schedule for the day" the night before.           Jullien Granquist LEWIS Time: 11:40a- 12:30p 50 min.

## 2022-12-01 ENCOUNTER — Ambulatory Visit: Admitting: Psychology

## 2022-12-15 ENCOUNTER — Other Ambulatory Visit: Payer: Self-pay | Admitting: Family Medicine

## 2022-12-15 DIAGNOSIS — Z1231 Encounter for screening mammogram for malignant neoplasm of breast: Secondary | ICD-10-CM

## 2022-12-18 ENCOUNTER — Ambulatory Visit
Admission: RE | Admit: 2022-12-18 | Discharge: 2022-12-18 | Disposition: A | Discharge status: Home / Self Care | Source: Ambulatory Visit | Attending: Family Medicine | Admitting: Family Medicine

## 2022-12-18 DIAGNOSIS — Z1231 Encounter for screening mammogram for malignant neoplasm of breast: Secondary | ICD-10-CM

## 2022-12-30 ENCOUNTER — Ambulatory Visit (INDEPENDENT_AMBULATORY_CARE_PROVIDER_SITE_OTHER): Admitting: Psychology

## 2022-12-30 DIAGNOSIS — F331 Major depressive disorder, recurrent, moderate: Secondary | ICD-10-CM
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
## 2023-01-15 ENCOUNTER — Ambulatory Visit (INDEPENDENT_AMBULATORY_CARE_PROVIDER_SITE_OTHER): Admitting: Allergy and Immunology

## 2023-01-15 ENCOUNTER — Encounter: Payer: Self-pay | Admitting: Allergy and Immunology

## 2023-01-15 VITALS — BP 106/74 | HR 80 | Resp 16 | Ht 60.0 in | Wt 163.2 lb

## 2023-01-15 DIAGNOSIS — J3089 Other allergic rhinitis: Secondary | ICD-10-CM | POA: Diagnosis not present

## 2023-01-15 DIAGNOSIS — J4540 Moderate persistent asthma, uncomplicated: Secondary | ICD-10-CM | POA: Diagnosis not present

## 2023-01-15 DIAGNOSIS — K219 Gastro-esophageal reflux disease without esophagitis: Secondary | ICD-10-CM | POA: Diagnosis not present

## 2023-01-15 MED ORDER — FEXOFENADINE HCL 180 MG PO TABS: ORAL_TABLET | ORAL | 5 refills | Status: DC

## 2023-01-15 MED ORDER — BREZTRI AEROSPHERE 160-9-4.8 MCG/ACT IN AERO: INHALATION_SPRAY | RESPIRATORY_TRACT | 5 refills | Status: DC

## 2023-01-15 NOTE — Patient Instructions (Addendum)
  1.  START Breztri -2 inhalations twice a day w/spacer (replaces Advair)  2.  Continue to Treat reflux with the following:  A. Pantoprazole 40 mg 2 times a day + famotidine 40 mg in PM B. Attempt to decrease size of evening meal and try to eat earlier in evening C. Nissen fundoplication??? Visit with Dr. Chales Abrahams  3. If needed:   A. Albuterol HFA - 2 inhalations every 4-6 hours  B. OTC antihistamines  4. Return to clinic in 12 weeks or earlier if needed  5. Obtain fall flu vaccine

## 2023-01-15 NOTE — Progress Notes (Signed)
[place] [facility] [place] [facility] [place] [facility] [place] - [place]   Follow-up Note  Referring Provider: Abner Greenspan, MD Primary Provider: Abner Greenspan, MD Date of Office Visit: 01/15/2023  Subjective:   Nancy Harris (DOB: 05-23-1966) is a 57 y.o. female who [facility] in re-evaluation of the following:  HPI: [name] returns to this clinic in evaluation of cough with component of respiratory tract inflammation and reflux.  I last saw her in this clinic 22 October 2022.  She still continues to have some cough and she still continues to have some throat clearing and she still continues to have some regurgitation events especially at nighttime.  She has been very good about using her Advair and her proton pump inhibitor and her H2 receptor blocker and she has minimized caffeine.  Rarely does she use the short acting bronchodilator.  She has had no problems with her nose.  All the bleeding is stopped.  She has not restarted any nasal steroid spray.  Allergies as of 01/15/2023       Reactions   Latex Itching        Medication List    albuterol 108 (90 Base) MCG/ACT inhaler Commonly known as: VENTOLIN HFA Inhale 2 puffs every 4-6 hours as needed for cough, wheeze, tightness in the chest, or shortness of breath   aspirin EC 81 MG tablet Take 81 mg by mouth at bedtime.   brexpiprazole 2 MG Tabs tablet Commonly known as: REXULTI Take 2 mg by mouth daily.   CALCIUM-VITAMIN D PO Take by mouth daily.   clonazePAM 1 MG tablet Commonly known as: KLONOPIN Take 1 mg by mouth daily after breakfast.   colesevelam 625 MG tablet Commonly known as: WELCHOL Take 1,875 mg by mouth 2 (two) times daily with a meal.   DEXTROAMPHETAMINE SULFATE PO Take 20 mg by mouth daily.   DULoxetine 30 MG capsule Commonly known as: CYMBALTA Take 90 mg by mouth daily.   ezetimibe 10 MG tablet Commonly known as: ZETIA   famotidine 40 MG tablet Commonly  known as: PEPCID Take one tablet by mouth at bedtime.   fluticasone 50 MCG/ACT nasal spray Commonly known as: FLONASE Place 1 to 2 sprays in each nostril once a day as needed for stuffy nose   fluticasone-salmeterol 115-21 MCG/ACT inhaler Commonly known as: Advair HFA Inhale 2 puffs twice a day with spacer to help prevent cough and wheeze.  Rinse, gargle, and spit after use.   Iron 325 (65 Fe) MG Tabs Take 1 tablet by mouth at bedtime.   levothyroxine 125 MCG tablet Commonly known as: SYNTHROID Take 125 mcg by mouth daily.   loratadine 10 MG tablet Commonly known as: CLARITIN Take by mouth.   metFORMIN 500 MG 24 hr tablet Commonly known as: GLUCOPHAGE-XR Take 500 mg by mouth 2 (two) times daily.   Methylphenidate HCl ER (OSM) 72 MG Tbcr   metoprolol succinate 50 MG 24 hr tablet Commonly known as: TOPROL-XL Take 50 mg by mouth daily after breakfast. Take with or immediately following a meal.   pantoprazole 40 MG tablet Commonly known as: PROTONIX TAKE 1 TABLET BY MOUTH TWICE DAILY AS DIRECTED.   pioglitazone 45 MG tablet Commonly known as: ACTOS Take 45 mg by mouth every morning.   rOPINIRole 2 MG tablet Commonly known as: REQUIP Take 2 mg by mouth at bedtime.   traZODone 100 MG tablet Commonly known as: DESYREL Take 150 mg by mouth at  bedtime as needed.   Trulicity 3 MG/0.5ML Sopn Generic drug: Dulaglutide Inject 3 mg into the skin once a week.   VITAMIN B-12 PO Take 1,000 mcg by mouth every morning.   Vitamin D (Ergocalciferol) 1.25 MG (50000 UNIT) Caps capsule Commonly known as: DRISDOL Take by mouth.    Past Medical History:  Diagnosis Date   ADD (attention deficit disorder)    Allergy    Anemia    past hx    Arthritis    Brown recluse spider bite 02/2022   required IV antibiotics   Chronic insomnia 09/03/2020   Chronic kidney disease    Chronic tension-type headache, not intractable 08/15/2015   Diabetes (HCC)    Diabetes mellitus without  complication    Essential tremor 11/30/2012   Excessive daytime sleepiness 09/03/2020   Generalized anxiety disorder    GERD (gastroesophageal reflux disease)    High cholesterol    History of skin cancer    Hyperlipidemia    Hypertension    Insomnia    Major depressive disorder    Neuromuscular disorder    HH, CTS, neuropathy feet and hands    Obstructive sleep apnea 11/06/2020   Osteoporosis    Paget's disease of bone 11/30/2017   Postmenopausal osteoporosis 11/30/2017   Renal cell carcinoma (HCC)    Restless leg syndrome    Thyroid disease     Past Surgical History:  Procedure Laterality Date   CESAREAN SECTION  10/1990   CHOLECYSTECTOMY  10/2000   COLONOSCOPY     NASAL SEPTUM SURGERY     07/2008 or 07/2010- pt unsure    nephrectomy Right 05/30/2022   TOTAL ABDOMINAL HYSTERECTOMY  10/2004   UPPER GASTROINTESTINAL ENDOSCOPY      Review of systems negative except as noted in HPI / PMHx or noted below:  Review of Systems  Constitutional: Negative.   HENT: Negative.    Eyes: Negative.   Respiratory: Negative.    Cardiovascular: Negative.   Gastrointestinal: Negative.   Genitourinary: Negative.   Musculoskeletal: Negative.   Skin: Negative.   Neurological: Negative.   Endo/Heme/Allergies: Negative.   Psychiatric/Behavioral: Negative.       Objective:   Vitals:   01/15/23 1613  BP: 106/74  Pulse: 80  Resp: 16  SpO2: 98%   Height: 5' (152.4 cm)  Weight: 163 lb 3.2 oz (74 kg)   Physical Exam Constitutional:      Appearance: She is not diaphoretic.  HENT:     Head: Normocephalic.     Right Ear: Tympanic membrane, ear canal and external ear normal.     Left Ear: Tympanic membrane, ear canal and external ear normal.     Nose: Nose normal. No mucosal edema or rhinorrhea.     Mouth/Throat:     Pharynx: Uvula midline. No oropharyngeal exudate.  Eyes:     Conjunctiva/sclera: Conjunctivae normal.  Neck:     Thyroid: No thyromegaly.     Trachea: Trachea  normal. No tracheal tenderness or tracheal deviation.  Cardiovascular:     Rate and Rhythm: Normal rate and regular rhythm.     Heart sounds: Normal heart sounds, S1 normal and S2 normal. No murmur heard. Pulmonary:     Effort: No respiratory distress.     Breath sounds: Normal breath sounds. No stridor. No wheezing or rales.  Lymphadenopathy:     Head:     Right side of head: No tonsillar adenopathy.     Left side of head: No tonsillar adenopathy.  Cervical: No cervical adenopathy.  Skin:    Findings: No erythema or rash.     Nails: There is no clubbing.  Neurological:     Mental Status: She is alert.     Diagnostics:    Spirometry was performed and demonstrated an FEV1 of 1.85 at 82 % of predicted.  The patient had an Asthma Control Test with the following results:  .    Assessment and Plan:   No diagnosis found.  Patient Instructions   1.  START Breztri -2 inhalations twice a day w/spacer (replaces Advair)  2.  Continue to Treat reflux with the following:  A. Pantoprazole 40 mg 2 times a day + famotidine 40 mg in PM B. Attempt to decrease size of evening meal and try to eat earlier in evening C. Nissen fundoplication??? Visit with Dr. Chales Abrahams  3. If needed:   A. Albuterol HFA - 2 inhalations every 4-6 hours  B. OTC antihistamines  4. Return to clinic in 12 weeks or earlier if needed  5. Obtain fall flu vaccine  Laurette Schimke, MD Allergy / Immunology [facility]

## 2023-01-19 ENCOUNTER — Encounter: Payer: Self-pay | Admitting: Allergy and Immunology

## 2023-01-27 ENCOUNTER — Ambulatory Visit (INDEPENDENT_AMBULATORY_CARE_PROVIDER_SITE_OTHER): Admitting: Psychology

## 2023-01-27 DIAGNOSIS — F331 Major depressive disorder, recurrent, moderate: Secondary | ICD-10-CM

## 2023-01-27 NOTE — Progress Notes (Signed)
                                Nancy Harris is a 57 y.o. female patient  Date: 01/27/2023   Diagnosis Bipolar,  complicated grief, depression Symptoms unspecified Medication Status compliance  Safety none  If Suicidal or Homicidal State Action Taken: unspecified  Current Risk: low Medications See chart Objectives unspecified Client Response full compliance  Service Location Location, [address] [place], [place] [address]  Service Code cpt [phone]  Facilitate problem solving  Normalize/Reframe  Relaxation training  Self-monitoring  Self care activities  Emotion regulation skills  Validate/empathize  Behavioral activation plan  Session notes:  Dx. Bipolar 1,  and Complicated Grief  Meds:Cymbalta (30mg  then increase to 90mg ), Clonazepam 1mg , Wellcall 625mg  (3, twice daily with food), dextroamphetamine 20mg  (1 in afternoon), Trulicity (1 injection/week), Zetia 10mg , Gabapentin 300mg  at bedtime, Metforman 500mg  twice daily, Toprol XL 50mg  in morning, Protonix 40mg  in evening, Actos 45 mg in morning, Rexulti 2 mg in morning, Trazedone 200mg , Vit. D2, Vivance 70mg  every morning, B12 and baby aspirin.   Goals: Patient states that she is seeking counseling to resolve depressive moods and manage complicated, unresolved grief. Goal date 12-24 Patient agrees to have a video Public affairs consultant) session and understands the limitations of this platform. She is at home and I am in my home office.  Patient says that she still has not been able to find work. She is applying for a number of different positions, but is not getting interviews. She questions some of the remote work opportunities, thinking they may be scams. Has been getting together with her daughter and trying to keep busy. She is concerned that her mind cannot stay focused long enough to get tasks sufficiently completed. Meds for the anxiety and attention do not seem to be working. I suggested she  consult with her psychiatrist about reviewing meds. She isnt scheduled to see him until Nov., so she needs to reach out now for a consult. Will keep a journal of anxiety and depression to share with provider.                 Nancy Harris LEWIS Time: 1:10p- 2:00p 50 min.

## 2023-02-16 ENCOUNTER — Ambulatory Visit: Admitting: Psychology

## 2023-02-16 DIAGNOSIS — F319 Bipolar disorder, unspecified: Secondary | ICD-10-CM

## 2023-02-16 DIAGNOSIS — F4381 Prolonged grief disorder: Secondary | ICD-10-CM

## 2023-02-16 DIAGNOSIS — F331 Major depressive disorder, recurrent, moderate: Secondary | ICD-10-CM

## 2023-02-16 NOTE — Progress Notes (Signed)
Nancy Harris is a 57 y.o. female patient  Date: 02/16/2023   Diagnosis Bipolar,  complicated grief, depression Symptoms unspecified Medication Status compliance  Safety none  If Suicidal or Homicidal State Action Taken: unspecified  Current Risk: low Medications See chart Objectives unspecified Client Response full compliance  Service Location Location, [address] [place], [place] [address]  Service Code cpt [id number]  Facilitate problem solving  Normalize/Reframe  Relaxation training  Self-monitoring  Self care activities  Emotion regulation skills  Validate/empathize  Behavioral activation plan  Session notes:  Dx. Bipolar 1,  and Complicated Grief  Meds:Cymbalta (30mg  then increase to 90mg ), Clonazepam 1mg , Wellcall 625mg  (3, twice daily with food), dextroamphetamine 20mg  (1 in afternoon), Trulicity (1 injection/week), Zetia 10mg , Gabapentin 300mg  at bedtime, Metforman 500mg  twice daily, Toprol XL 50mg  in morning, Protonix 40mg  in evening, Actos 45 mg in morning, Rexulti 2 mg in morning, Trazedone 200mg , Vit. D2, Vivance 70mg  every morning, B12 and baby aspirin.   Goals: Patient states that she is seeking counseling to resolve depressive moods and manage complicated, unresolved grief. Goal date 12-24 Patient agrees to have a video Public affairs consultant) session and understands the limitations of this platform. She is at home and I am in my home office.  Patient states that there has been little change in her life since her last session. She does have two interviews this week and is hopeful. One is a Conservation officer, nature and the other is in Airline pilot at [employer]. She is still putting in applications at other jobs, but hasn't heard anything back. She is experiencing significant financial problems. She is trying to "stay ahead" of the situation, but it is getting more difficult. She did see her  doctor about her medicines and she was put back on Vivance. She had problems with getting the medicine in the past due to availability, but it is apparently back in stock in a generic form. She hasn't started yet, but will be on it as of next week. It was helpful in the pas and she is hopeful looking forward. She tends to give in to the desire to sleep. She had a very busy week going to appointments and helping her sister. She has had a couple of sleepless nights. Last week she went to social services and a couple of other places for financial help and she says that she did not qualify because of her veteran's benefits. She gets $23/month from food stamps. She says she was told that without a minor in the home she cannot qualify. She has talked to two of her doctors, both of whom feel she should apply for disability. She is going to ask her sister for money to hire her an attorney to help her get on disability. She feels that she needs the support because she had not worked the required number of hours to get disability. Encouraged her to move forward with this plan.                   Scotti Kosta LEWIS Time: 10:40a- 11:30a 50 min.

## 2023-02-26 ENCOUNTER — Ambulatory Visit: Admitting: Psychology

## 2023-03-08 ENCOUNTER — Other Ambulatory Visit: Payer: Self-pay | Admitting: Allergy and Immunology

## 2023-03-26 ENCOUNTER — Ambulatory Visit: Admitting: Psychology

## 2023-03-26 DIAGNOSIS — F331 Major depressive disorder, recurrent, moderate: Secondary | ICD-10-CM

## 2023-03-26 DIAGNOSIS — F319 Bipolar disorder, unspecified: Secondary | ICD-10-CM | POA: Diagnosis not present

## 2023-03-26 DIAGNOSIS — F4381 Prolonged grief disorder: Secondary | ICD-10-CM

## 2023-03-26 NOTE — Progress Notes (Signed)
                                                              Nancy Harris is a 57 y.o. female patient  Date: 03/26/2023   Diagnosis Bipolar,  complicated grief, depression Symptoms unspecified Medication Status compliance  Safety none  If Suicidal or Homicidal State Action Taken: unspecified  Current Risk: low Medications See chart Objectives unspecified Client Response full compliance  Service Location Location, [address] [place], [place] [address]  Service Code cpt [id number]  Facilitate problem solving  Normalize/Reframe  Relaxation training  Self-monitoring  Self care activities  Emotion regulation skills  Validate/empathize  Behavioral activation plan  Session notes:  Dx. Bipolar 1,  and Complicated Grief  Meds:Cymbalta (30mg  then increase to 90mg ), Clonazepam 1mg , Wellcall 625mg  (3, twice daily with food), dextroamphetamine 20mg  (1 in afternoon), Trulicity (1 injection/week), Zetia 10mg , Gabapentin 300mg  at bedtime, Metforman 500mg  twice daily, Toprol XL 50mg  in morning, Protonix 40mg  in evening, Actos 45 mg in morning, Rexulti 2 mg in morning, Trazedone 200mg , Vit. D2, Vivance 70mg  every morning, B12 and baby aspirin.   Goals: Patient states that she is seeking counseling to resolve depressive moods and manage complicated, unresolved grief. Goal date 12-24 Patient agrees to have a video Public affairs consultant) session and understands the limitations of this platform. She is at home and I am in my home office.  Patient states she is still not sleeping well most nights. Her finances are very problematic. She is spending much of her day in bed and watching TV. She met with daughter about selling her house, getting caught up on bills and moving into their basement. She will continue to talk to her family about her options.  Discussed her need for a neuropsych. Eval. To get disability (as per her psychiatrist). Gave  her a name/number to call.                     Nancy Harris Time: 1:10p- 2:00p 50 min.

## 2023-04-09 ENCOUNTER — Encounter: Payer: Self-pay | Admitting: Allergy and Immunology

## 2023-04-09 ENCOUNTER — Ambulatory Visit: Admitting: Allergy and Immunology

## 2023-04-09 VITALS — BP 102/64 | HR 76 | Resp 12

## 2023-04-09 DIAGNOSIS — J3089 Other allergic rhinitis: Secondary | ICD-10-CM

## 2023-04-09 DIAGNOSIS — K219 Gastro-esophageal reflux disease without esophagitis: Secondary | ICD-10-CM

## 2023-04-09 DIAGNOSIS — J4540 Moderate persistent asthma, uncomplicated: Secondary | ICD-10-CM | POA: Diagnosis not present

## 2023-04-09 DIAGNOSIS — J340 Abscess, furuncle and carbuncle of nose: Secondary | ICD-10-CM

## 2023-04-09 MED ORDER — AIRSUPRA 90-80 MCG/ACT IN AERO: 2.0000 | INHALATION_SPRAY | RESPIRATORY_TRACT | 1 refills | Status: AC | PRN

## 2023-04-09 NOTE — Patient Instructions (Addendum)
  1.  Continue Breztri -2 inhalations twice a day w/spacer   2.  Continue to Treat reflux with the following:  A. Pantoprazole 40 mg 2 times a day + famotidine 40 mg in PM B. Attempt to decrease size of evening meal and try to eat earlier in evening C. Nissen fundoplication???  D. Visit with Dr. Chales Abrahams - November  3. If needed:   A. Airsupra - 2 inhalations every 4-6 hours (replaces albuterol)  B. OTC antihistamines  4. Use nasal saline, then Bactroban inside nose 3 times a day for 10 days  5. Return to clinic December 2024 or earlier if needed  6. Obtain fall flu vaccine

## 2023-04-09 NOTE — Progress Notes (Signed)
[place] [facility] [place] [facility] [place] [facility] [place] - [place]   Follow-up Note  Referring Provider: Abner Greenspan, MD Primary Provider: Abner Greenspan, MD Date of Office Visit: 04/09/2023  Subjective:   Nancy Harris (DOB: [birth year]) is a 57 y.o. female who [facility]23 in re-evaluation of the following:  HPI: Nancy Harris returns to this clinic in evaluation of of asthma and reflux contributing to cough.  I last saw her in this clinic 15 January 2023.  She has really done very well regarding her airway.  Her use of the short acting bronchodilator is twice a month.  She has had very little problems with her nose.  She has not required a systemic steroid or antibiotic for any type of airway issue.  Her reflux is a lot better.  She does not have much regurgitation and she does not have much throat issues.  She has an appointment to see Dr. Chales Abrahams November 2024.  She has noticed the sore come back on her left nasal septum.  She tells me that she has had this issue since 2011 and when it does appear, it will stay for months and then it will resolve.  On occasion it causes bleeding but with this recent episode it is not cause bleeding.  We had her treat this episode back in April and she did respond with complete resolution of the nasal septal ulcer with that therapy.  Allergies as of 04/09/2023       Reactions   Latex Itching        Medication List    albuterol 108 (90 Base) MCG/ACT inhaler Commonly known as: VENTOLIN HFA Inhale 2 puffs every 4-6 hours as needed for cough, wheeze, tightness in the chest, or shortness of breath   aspirin EC 81 MG tablet Take 81 mg by mouth at bedtime.   brexpiprazole 2 MG Tabs tablet Commonly known as: REXULTI Take 2 mg by mouth daily.   Breztri Aerosphere 160-9-4.8 MCG/ACT Aero Generic drug: Budeson-Glycopyrrol-Formoterol Inhale two puffs with spacer twice daily to prevent cough or wheeze.  Rinse, gargle, and  spit after use.   CALCIUM-VITAMIN D PO Take by mouth daily.   clonazePAM 1 MG tablet Commonly known as: KLONOPIN Take 1 mg by mouth daily after breakfast.   colesevelam 625 MG tablet Commonly known as: WELCHOL Take 1,875 mg by mouth 2 (two) times daily with a meal.   DEXTROAMPHETAMINE SULFATE PO Take 20 mg by mouth daily.   DULoxetine 30 MG capsule Commonly known as: CYMBALTA Take 90 mg by mouth daily.   ezetimibe 10 MG tablet Commonly known as: ZETIA   famotidine 40 MG tablet Commonly known as: PEPCID Take one tablet by mouth at bedtime.   fexofenadine 180 MG tablet Commonly known as: ALLEGRA Can take one tablet by mouth once daily if needed.   Iron 325 (65 Fe) MG Tabs Take 1 tablet by mouth at bedtime.   levothyroxine 125 MCG tablet Commonly known as: SYNTHROID Take 125 mcg by mouth daily.   metFORMIN 500 MG 24 hr tablet Commonly known as: GLUCOPHAGE-XR Take 500 mg by mouth 2 (two) times daily.   metoprolol succinate 50 MG 24 hr tablet Commonly known as: TOPROL-XL Take 50 mg by mouth daily after breakfast. Take with or immediately following a meal.   pantoprazole 40 MG tablet Commonly known as: PROTONIX TAKE 1 TABLET TWICE A DAY AS DIRECTED   pioglitazone 45 MG tablet Commonly known as: ACTOS  Take 45 mg by mouth every morning.   rOPINIRole 2 MG tablet Commonly known as: REQUIP Take 2 mg by mouth at bedtime.   traZODone 100 MG tablet Commonly known as: DESYREL Take 150 mg by mouth at bedtime as needed.   VITAMIN B-12 PO Take 1,000 mcg by mouth every morning.   Vitamin D (Ergocalciferol) 1.25 MG (50000 UNIT) Caps capsule Commonly known as: DRISDOL Take by mouth.   Vyvanse 50 MG capsule Generic drug: lisdexamfetamine Take 50 mg by mouth every morning.    Past Medical History:  Diagnosis Date   ADD (attention deficit disorder)    Allergy    Anemia    past hx    Arthritis    Brown recluse spider bite 02/2022   required IV antibiotics    Chronic insomnia 09/03/2020   Chronic kidney disease    Chronic tension-type headache, not intractable 08/15/2015   Diabetes (HCC)    Diabetes mellitus without complication    Essential tremor 11/30/2012   Excessive daytime sleepiness 09/03/2020   Generalized anxiety disorder    GERD (gastroesophageal reflux disease)    High cholesterol    History of skin cancer    Hyperlipidemia    Hypertension    Insomnia    Major depressive disorder    Neuromuscular disorder    HH, CTS, neuropathy feet and hands    Obstructive sleep apnea 11/06/2020   Osteoporosis    Paget's disease of bone 11/30/2017   Postmenopausal osteoporosis 11/30/2017   Renal cell carcinoma (HCC)    Restless leg syndrome    Thyroid disease     Past Surgical History:  Procedure Laterality Date   CESAREAN SECTION  10/1990   CHOLECYSTECTOMY  10/2000   COLONOSCOPY     NASAL SEPTUM SURGERY     07/2008 or 07/2010- pt unsure    nephrectomy Right 05/30/2022   TOTAL ABDOMINAL HYSTERECTOMY  10/2004   UPPER GASTROINTESTINAL ENDOSCOPY      Review of systems negative except as noted in HPI / PMHx or noted below:  Review of Systems  Constitutional: Negative.   HENT: Negative.    Eyes: Negative.   Respiratory: Negative.    Cardiovascular: Negative.   Gastrointestinal: Negative.   Genitourinary: Negative.   Musculoskeletal: Negative.   Skin: Negative.   Neurological: Negative.   Endo/Heme/Allergies: Negative.   Psychiatric/Behavioral: Negative.       Objective:   Vitals:   04/09/23 1640  BP: 102/64  Pulse: 76  Resp: 12  SpO2: 96%          Physical Exam Constitutional:      Appearance: She is not diaphoretic.  HENT:     Head: Normocephalic.     Right Ear: Tympanic membrane, ear canal and external ear normal.     Left Ear: Tympanic membrane, ear canal and external ear normal.     Nose: Nose normal. No mucosal edema (several mm superficial left septal ulcer w/o bleeding) or rhinorrhea.      Mouth/Throat:     Pharynx: Uvula midline. No oropharyngeal exudate.  Eyes:     Conjunctiva/sclera: Conjunctivae normal.  Neck:     Thyroid: No thyromegaly.     Trachea: Trachea normal. No tracheal tenderness or tracheal deviation.  Cardiovascular:     Rate and Rhythm: Normal rate and regular rhythm.     Heart sounds: Normal heart sounds, S1 normal and S2 normal. No murmur heard. Pulmonary:     Effort: No respiratory distress.     Breath  sounds: Normal breath sounds. No stridor. No wheezing or rales.  Lymphadenopathy:     Head:     Right side of head: No tonsillar adenopathy.     Left side of head: No tonsillar adenopathy.     Cervical: No cervical adenopathy.  Skin:    Findings: No erythema or rash.     Nails: There is no clubbing.  Neurological:     Mental Status: She is alert.     Diagnostics: Spirometry was performed and demonstrated an FEV1 of 1.77 at 79 % of predicted.   Assessment and Plan:   1. Asthma, moderate persistent, well-controlled   2. Perennial allergic rhinitis   3. LPRD (laryngopharyngeal reflux disease)   4. Nasal septum ulceration    1.  Continue Breztri -2 inhalations twice a day w/spacer   2.  Continue to Treat reflux with the following:  A. Pantoprazole 40 mg 2 times a day + famotidine 40 mg in PM B. Attempt to decrease size of evening meal and try to eat earlier in evening C. Nissen fundoplication???  D. Visit with Dr. Chales Abrahams - November  3. If needed:   A. Airsupra - 2 inhalations every 4-6 hours (replaces albuterol)  B. OTC antihistamines  4. Use nasal saline, then Bactroban inside nose 3 times a day for 10 days  5. Return to clinic December 2024 or earlier if needed  6. Obtain fall flu vaccine  Nancy Harris appears to be doing much better at this point in time regarding her airway and her reflux and we will continue to have her use the selection of anti-inflammatory agents for airway and combination of proton pump inhibitor and H2 receptor  blocker.  We will give her an anti-inflammatory rescue medicine to use should it be required.  She once again has developed a relatively punctate left-sided nasal septal ulceration and we will treated once again with Bactroban.  Given the fact that this is a relatively transient issue and response to treatment and has been present since 2011 we can probably just treat this issue intermittently whenever it arises.  Certainly if its pattern changes we will need to have ENT take a look at this.  Laurette Schimke, MD Allergy / Immunology  [facility]

## 2023-04-13 ENCOUNTER — Encounter: Payer: Self-pay | Admitting: Allergy and Immunology

## 2023-04-15 ENCOUNTER — Other Ambulatory Visit (HOSPITAL_COMMUNITY): Payer: Self-pay

## 2023-04-24 ENCOUNTER — Ambulatory Visit (INDEPENDENT_AMBULATORY_CARE_PROVIDER_SITE_OTHER): Admitting: Psychology

## 2023-04-24 DIAGNOSIS — F319 Bipolar disorder, unspecified: Secondary | ICD-10-CM

## 2023-04-24 DIAGNOSIS — F4381 Prolonged grief disorder: Secondary | ICD-10-CM

## 2023-04-24 DIAGNOSIS — F331 Major depressive disorder, recurrent, moderate: Secondary | ICD-10-CM

## 2023-04-27 ENCOUNTER — Other Ambulatory Visit: Payer: Self-pay | Admitting: Allergy and Immunology

## 2023-04-28 NOTE — Progress Notes (Addendum)
                                                                             Nancy Harris is a 57 y.o. female patient     Diagnosis Bipolar,  complicated grief, depression Symptoms unspecified Medication Status compliance  Safety none  If Suicidal or Homicidal State Action Taken: unspecified  Current Risk: low Medications See chart Objectives unspecified Client Response full compliance  Service Location Location, [address] [place], [place] [address]  Service Code cpt [phone]  Facilitate problem solving  Normalize/Reframe  Relaxation training  Self-monitoring  Self care activities  Emotion regulation skills  Validate/empathize  Behavioral activation plan  Session notes:  Dx. Bipolar 1,  and Complicated Grief  Meds:Cymbalta (30mg  then increase to 90mg ), Clonazepam 1mg , Wellcall 625mg  (3, twice daily with food), dextroamphetamine 20mg  (1 in afternoon), Trulicity (1 injection/week), Zetia 10mg , Gabapentin 300mg  at bedtime, Metforman 500mg  twice daily, Toprol XL 50mg  in morning, Protonix 40mg  in evening, Actos 45 mg in morning, Rexulti 2 mg in morning, Trazedone 200mg , Vit. D2, Vivance 70mg  every morning, B12 and baby aspirin.  Patient agrees to a video Public affairs consultant) session and understands the limits of this platform. She is at home and provider in his home office.   Nancy Harris says that she is physically and emotionally "worn out". She has made the decision to sell her house and move in with her daughter who lives near [facility]. This will be a difficult and emotional move, as she has lived in her home for many years and it where she and her husband lived together. This move (on Nov. 19th) will be a significant loss for her. She reports that this will make her feel like she "has nothing" in that she will no longer be the owner of a home and will only have her personal possessions. Her job situation has  not changed with no good prospects in front of her. She does say that she has initiated a disability claim, but that this could take a long time until approve. She does say she is feeling some depression even though she knows she is making the appropriate ("right") decision.                       Nancy Harris Time: 10:40a- 11:30a 50 min.

## 2023-05-04 ENCOUNTER — Telehealth: Payer: Self-pay

## 2023-05-04 NOTE — Telephone Encounter (Signed)
Called and reminded pt to bring machine and power cord.

## 2023-05-04 NOTE — Telephone Encounter (Signed)
Please contact pt and remind them to bring cpap machine to appt

## 2023-05-05 NOTE — Progress Notes (Unsigned)
PATIENT: Nancy Harris DOB: September 25, 1965  REASON FOR VISIT: Follow up HISTORY FROM: Patient PRIMARY NEUROLOGIST: Dr. Vickey Huger   HISTORY OF PRESENT ILLNESS: Today 05/05/23   10/30/22 SS: CPAP data from 08/02/22-10/30/22 29/90 days 32% > 4 hours 17 days 19%. 6-12 cm water, EPR 2. AHI 3.0. Leak 14.2. CPAP use is poor, is not sleeping well. She takes it off if she can't fall asleep. Using nasal pillow mask. Has been diagnosed with depression. Sees therapist. Takes trazodone for sleep. March 2022, sleep study showed moderate severe OSA 23.8/hour. ESS 17. Never started her work at [employer]. Interview next week to work at [employer]  Also on Adderall, Rexulti, Klonopin, Cymbalta, Ritalin, Requip, trazodone.  04/22/22 SS: Nancy Harris is here today for follow-up. Mentions recently had spider bite, admitted for 6 days for antibiotics at [place]. 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 Has follow up about this next week. 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 Doing virtual visit with counselor. Since off Effexor, on Cymbalta, feels better. More upbeat. Still has daytime sleepiness. Remains on Vyvanse, Adderall, from psychiatry. Stopped her CPAP for 2 months, got lazy, didn't want to clean it. Felt more tired.  She has a card download, data from 03/23/22-04/21/22 shows suboptimal usage 21/30 days at 70%, greater than 4 hours 15 days at 50%.  Average usage days used 5 hours 42 minutes.  Minimum pressure 6, maximum pressure 14 cm water.  Leak in the 95th percentile 19.3.  AHI 1.7.  Uses full facemask. ESS 18.   Update 06/27/2021 SS: Trinda Pascal care today for follow-up with history of bipolar disorder, chronic insomnia, memory disturbance, and sleep apnea on CPAP.  Saw Dr. 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  Had neuropsychological testing, was largely within normal limits.  Her responses suggest she is depressed, discouraged,  and withdrawal.  Cognitive dysfunction most likely related to ongoing psychiatric distress.  Not suggestive of any form of neurodegenerative illness. Had cognitive training via speech therapy. Seeing psychiatry, doesn't feel like much as changed in regards to her anxiety, depression, grief. They are adjusting her medications. Living alone. 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 Going to try harder with time management not to be late. Using CPAP, doing well, uses most all night, no issues. Knows sleeping better, not sure she feels much different. Headaches are much less, 1 every few weeks, will take Advil, but usually doesn't need anything. Has been taken off Topamax, on less gabapentin and Klonopin. Seeing Dr. Dellia Cloud with psychology at [facility], doing VV every few weeks. CPAP has a card.   HISTORY  11/06/2020 Dr. Anne Hahn: Nancy Harris is a 57 year old right-handed white female with history of bipolar disorder.  The patient is followed through [facility] for her depression.  The patient has been noted to have significant fatigue during the day.  She was set up for sleep evaluation at showed evidence of sleep apnea, she has not yet been placed on CPAP.  The patient has chronic insomnia, she takes melatonin around 8 PM but does not really get sleep until 1 or 2 AM.  On weekends, she may sleep until afternoon.  She has to go to work at around 11 AM, but she oftentimes shows up late.  She is now getting cognitive training through speech therapy, this is helping some.  She has had a lifelong problem  with time management.  She was taken off of her Topamax which seems to have helped some with her headaches but she still has frequent early morning headaches that she wakes up with.  The patient reports a 20 pound weight gain in the last year.  She still has some ongoing chronic fatigue throughout the day but she takes the clonazepam 1 mg in the morning.  She is off of her sleeping pills  at night, she has reduced the gabapentin to 300 mg twice daily.  She has had some increase in anxiety on the lower dose of gabapentin.  The patient returns to the office today for an evaluation.  REVIEW OF SYSTEMS: Out of a complete 14 system review of symptoms, the patient complains only of the following symptoms, and all other reviewed systems are negative.  See HPI  ALLERGIES: Allergies  Allergen Reactions   Latex Itching    HOME MEDICATIONS: Outpatient Medications Prior to Visit  Medication Sig Dispense Refill   AIRSUPRA 90-80 MCG/ACT AERO Inhale 2 puffs into the lungs as needed (every four to six hours for cough, wheeze, shortness of breath.  Rinse, gargle, and spit after use). 10.7 g 1   albuterol (VENTOLIN HFA) 108 (90 Base) MCG/ACT inhaler Inhale 2 puffs every 4-6 hours as needed for cough, wheeze, tightness in the chest, or shortness of breath 8 g 1   aspirin EC 81 MG tablet Take 81 mg by mouth at bedtime.     brexpiprazole (REXULTI) 2 MG TABS tablet Take 2 mg by mouth daily.     Budeson-Glycopyrrol-Formoterol (BREZTRI AEROSPHERE) 160-9-4.8 MCG/ACT AERO Inhale two puffs with spacer twice daily to prevent cough or wheeze.  Rinse, gargle, and spit after use. 10.7 g 5   CALCIUM-VITAMIN D PO Take by mouth daily.     clonazePAM (KLONOPIN) 1 MG tablet Take 1 mg by mouth daily after breakfast.     colesevelam (WELCHOL) 625 MG tablet Take 1,875 mg by mouth 2 (two) times daily with a meal.     Cyanocobalamin (VITAMIN B-12 PO) Take 1,000 mcg by mouth every morning.     DEXTROAMPHETAMINE SULFATE PO Take 20 mg by mouth daily.     DULoxetine (CYMBALTA) 30 MG capsule Take 90 mg by mouth daily.     ezetimibe (ZETIA) 10 MG tablet      famotidine (PEPCID) 40 MG tablet TAKE 1 TABLET AT BEDTIME 90 tablet 3   Ferrous Sulfate (IRON) 325 (65 Fe) MG TABS Take 1 tablet by mouth at bedtime.     fexofenadine (ALLEGRA) 180 MG tablet Can take one tablet by mouth once daily if needed. 30 tablet 5    levothyroxine (SYNTHROID) 125 MCG tablet Take 125 mcg by mouth daily.     metFORMIN (GLUCOPHAGE-XR) 500 MG 24 hr tablet Take 500 mg by mouth 2 (two) times daily.     metoprolol succinate (TOPROL-XL) 50 MG 24 hr tablet Take 50 mg by mouth daily after breakfast. Take with or immediately following a meal.     pantoprazole (PROTONIX) 40 MG tablet TAKE 1 TABLET TWICE A DAY AS DIRECTED 180 tablet 1   pioglitazone (ACTOS) 45 MG tablet Take 45 mg by mouth every morning.     rOPINIRole (REQUIP) 2 MG tablet Take 2 mg by mouth at bedtime.     traZODone (DESYREL) 100 MG tablet Take 150 mg by mouth at bedtime as needed.     Vitamin D, Ergocalciferol, (DRISDOL) 1.25 MG (50000 UNIT) CAPS capsule Take by mouth.  VYVANSE 50 MG capsule Take 50 mg by mouth every morning.     No facility-administered medications prior to visit.    PAST MEDICAL HISTORY: Past Medical History:  Diagnosis Date   ADD (attention deficit disorder)    Allergy    Anemia    past hx    Arthritis    Brown recluse spider bite 02/2022   required IV antibiotics   Chronic insomnia 09/03/2020   Chronic kidney disease    Chronic tension-type headache, not intractable 08/15/2015   Diabetes (HCC)    Diabetes mellitus without complication    Essential tremor 11/30/2012   Excessive daytime sleepiness 09/03/2020   Generalized anxiety disorder    GERD (gastroesophageal reflux disease)    High cholesterol    History of skin cancer    Hyperlipidemia    Hypertension    Insomnia    Major depressive disorder    Neuromuscular disorder    HH, CTS, neuropathy feet and hands    Obstructive sleep apnea 11/06/2020   Osteoporosis    Paget's disease of bone 11/30/2017   Postmenopausal osteoporosis 11/30/2017   Renal cell carcinoma (HCC)    Restless leg syndrome    Thyroid disease     PAST SURGICAL HISTORY: Past Surgical History:  Procedure Laterality Date   CESAREAN SECTION  10/1990   CHOLECYSTECTOMY  10/2000   COLONOSCOPY      ESOPHAGOGASTRODUODENOSCOPY  09/26/2015   Mild gastritis Retained food. Otherwiser normal EGD   NASAL SEPTUM SURGERY     07/2008 or 07/2010- pt unsure    nephrectomy Right 05/30/2022   TOTAL ABDOMINAL HYSTERECTOMY  10/2004   UPPER GASTROINTESTINAL ENDOSCOPY      FAMILY HISTORY: Family History  Problem Relation Age of Onset   Lung cancer Mother        Smoker   Alzheimer's disease Mother        early-onset; approximately 76   Diabetes Father    Lung cancer Father        Smoker   Diabetes Sister    Allergic rhinitis Sister    Diabetes Sister    Asthma Sister    Alzheimer's disease Maternal Grandfather    Colon cancer Paternal Grandmother    Breast cancer Neg Hx    Colon polyps Neg Hx    Esophageal cancer Neg Hx    Rectal cancer Neg Hx    Stomach cancer Neg Hx     SOCIAL HISTORY: Social History   Socioeconomic History   Marital status: Widowed    Spouse name: Not on file   Number of children: Not on file   Years of education: 12   Highest education level: High school graduate  Occupational History   Occupation: Conservation officer, nature    Comment: Part time  Tobacco Use   Smoking status: Never   Smokeless tobacco: Never  Substance and Sexual Activity   Alcohol use: Not Currently   Drug use: Never   Sexual activity: Not on file  Other Topics Concern   Not on file  Social History Narrative   Lives alone   Right Handed   Drinks caffeine occassionally   Social Determinants of Health   Financial Resource Strain: Not on file  Food Insecurity: Not on file  Transportation Needs: Not on file  Physical Activity: Not on file  Stress: Not on file  Social Connections: Not on file  Intimate Partner Violence: Not on file   PHYSICAL EXAM  There were no vitals filed for this visit.  There is no height or weight on file to calculate BMI.  Generalized: Well developed, in no acute distress, well-dressed, appearing Neurological examination  Mentation: Alert oriented to time, place,  history taking. Follows all commands speech and language fluent Cranial nerve II-XII: Pupils were equal round reactive to light. Extraocular movements were full, visual field were full on confrontational test. Facial sensation and strength were normal. Head turning and shoulder shrug  were normal and symmetric. Motor: The motor testing reveals 5 over 5 strength of all 4 extremities. Good symmetric motor tone is noted throughout.  Sensory: Sensory testing is intact to soft touch on all 4 extremities. No evidence of extinction is noted.  Coordination: Cerebellar testing reveals good finger-nose-finger and heel-to-shin bilaterally.  No tremor was seen. Gait and station: Gait is normal.  Reflexes: Deep tendon reflexes are symmetric and normal bilaterally.   DIAGNOSTIC DATA (LABS, IMAGING, TESTING) - I reviewed patient records, labs, notes, testing and imaging myself where available.  No results found for: "WBC", "HGB", "HCT", "MCV", "PLT" No results found for: "NA", "K", "CL", "CO2", "GLUCOSE", "BUN", "CREATININE", "CALCIUM", "PROT", "ALBUMIN", "AST", "ALT", "ALKPHOS", "BILITOT", "GFRNONAA", "GFRAA" No results found for: "CHOL", "HDL", "LDLCALC", "LDLDIRECT", "TRIG", "CHOLHDL" No results found for: "HGBA1C" Lab Results  Component Value Date   VITAMINB12 1,060 07/04/2020   Lab Results  Component Value Date   TSH 0.398 (L) 07/04/2020   ASSESSMENT AND PLAN 57 y.o. year old female  has a past medical history of ADD (attention deficit disorder), Allergy, Anemia, Arthritis, Brown recluse spider bite (02/2022), Chronic insomnia (09/03/2020), Chronic kidney disease, Chronic tension-type headache, not intractable (08/15/2015), Diabetes (HCC), Diabetes mellitus without complication, Essential tremor (11/30/2012), Excessive daytime sleepiness (09/03/2020), Generalized anxiety disorder, GERD (gastroesophageal reflux disease), High cholesterol, History of skin cancer, Hyperlipidemia, Hypertension, Insomnia,  Major depressive disorder, Neuromuscular disorder, Obstructive sleep apnea (11/06/2020), Osteoporosis, 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 here with:  1.  Bipolar disorder, depression   2.  Sleep apnea   3.  Memory disturbance   4.  Chronic insomnia  -CPAP usage continues to be suboptimal, again we have discussed using nightly for a minimum of 4 hours -She seems motivated to improve CPAP usage -She is on multiple stimulants and mood managing medications -PSG in March 2022 showed moderate severe OSA with AHI 23.8/hour -Neuropsychological testing was largely within normal limits, cognitive dysfunction was felt most likely related to psychiatric distress -Dr. Vickey Huger felt her persistent hypersomnia was not apnea related -Follow-up in 6 months for CPAP  Margie Ege, AGNP-C, DNP 05/05/2023, 9:15 PM [facility] [address] [place], [place] [address] [phone]

## 2023-05-06 ENCOUNTER — Ambulatory Visit (INDEPENDENT_AMBULATORY_CARE_PROVIDER_SITE_OTHER): Admitting: Neurology

## 2023-05-06 ENCOUNTER — Encounter: Payer: Self-pay | Admitting: Neurology

## 2023-05-06 VITALS — BP 104/54 | HR 67 | Ht 60.0 in | Wt 163.6 lb

## 2023-05-06 DIAGNOSIS — G4733 Obstructive sleep apnea (adult) (pediatric): Secondary | ICD-10-CM

## 2023-05-06 NOTE — Patient Instructions (Signed)
Recommend CPAP usage > 4 hours nightly for good compliance.

## 2023-05-12 ENCOUNTER — Ambulatory Visit (INDEPENDENT_AMBULATORY_CARE_PROVIDER_SITE_OTHER): Admitting: Gastroenterology

## 2023-05-12 ENCOUNTER — Encounter: Payer: Self-pay | Admitting: Gastroenterology

## 2023-05-12 VITALS — BP 122/80 | HR 58 | Ht 60.0 in | Wt 163.0 lb

## 2023-05-12 DIAGNOSIS — K5909 Other constipation: Secondary | ICD-10-CM | POA: Diagnosis not present

## 2023-05-12 DIAGNOSIS — K219 Gastro-esophageal reflux disease without esophagitis: Secondary | ICD-10-CM

## 2023-05-12 NOTE — Patient Instructions (Addendum)
_______________________________________________________  If your blood pressure at your visit was 140/90 or greater, please contact your primary care physician to follow up on this.  _______________________________________________________  If you are age 57 or older, your body mass index should be between 23-30. Your Body mass index is 31.83 kg/m. If this is out of the aforementioned range listed, please consider follow up with your Primary Care Provider.  If you are age 11 or younger, your body mass index should be between 19-25. Your Body mass index is 31.83 kg/m. If this is out of the aformentioned range listed, please consider follow up with your Primary Care Provider.   ________________________________________________________  The [facility] providers would like to encourage you to use [facility] to communicate with providers for non-urgent requests or questions.  Due to long hold times on the telephone, sending your provider a message by [facility] may be a faster and more efficient way to get a response.  Please allow 48 business hours for a response.  Please remember that this is for non-urgent requests.  _______________________________________________________  Continue protonix 40mg  2 times a day and pepcid 20mg  at bedtime  Hold iron tablet a week prior to the procedure  You have been scheduled for an endoscopy. Please follow written instructions given to you at your visit today.  If you use inhalers (even only as needed), please bring them with you on the day of your procedure.  If you take any of the following medications, they will need to be adjusted prior to your procedure:   DO NOT TAKE 7 DAYS PRIOR TO TEST- Trulicity (dulaglutide) Ozempic, Wegovy (semaglutide) Mounjaro (tirzepatide) Bydureon Bcise (exanatide extended release)  DO NOT TAKE 1 DAY PRIOR TO YOUR TEST Rybelsus (semaglutide) Adlyxin (lixisenatide) Victoza (liraglutide) Byetta  (exanatide) ___________________________________________________________________________  Thank you,  Dr. Lynann Bologna

## 2023-05-12 NOTE — Progress Notes (Signed)
Chief Complaint: For further eval  Referring Provider:  Abner Greenspan, MD      ASSESSMENT AND PLAN;   #1. Longstanding GERD despite PPIs. Assoc asthma, LPR  #2. Chronic constipation  Plan: -Continue protonix 40mg  po BID -Continue pepcid 20mg  po at bedtime -EGD for further evaluation -If still with problems GES followed by evaluation for TIF/Nissen's. -Nonpharmacologic means of reflux control. -Please resume MiraLAX 17 g p.o. daily   HPI:    Nancy Harris is a 57 y.o. female  With multiple medical problems including DM2 (with mild neuropathy), IBS, anxiety/depression, HTN, HLD, CKD, history of RCC s/p right nephrectomy, OSA with chronic insomnia  Also with asthma/reflux contributing to cough Seen here on advice of Dr. Lucie Leather  With continued postprandial regurgitation, reflux, waterbrash and x yrs, most prominently over last 2 years. Denies having any odynophagia, dysphagia, nausea, vomiting.  Heartburn is better (but not fully controlled) with twice daily Protonix and Pepcid nightly.  Also with nocturnal regurgitation.  Somewhat better after adding Pepcid at night.  Here for consideration for lap Nissen's (See Dr Kathyrn Lass note)  Other option would be TIF  I have discussed extensively.  Would proceed with EGD, then GES followed by evaluation for TIF if continued problems.  Asthma is under better control currently.  She also has laryngopharyngeal reflux and would also like to get off PPIs due to history of osteoporosis  Continues to have chronic constipation, better when she takes MiraLAX.  No recent weight loss.  Diabetes under good control with hemoglobin A1c 5.2   Wt Readings from Last 3 Encounters:  05/12/23 163 lb (73.9 kg)  05/06/23 163 lb 9.6 oz (74.2 kg)  01/15/23 163 lb 3.2 oz (74 kg)   Past GI history:  EGD 08/2015 -Mild gastritis. Bx- neg for HP -Retained food -Otherwise normal EGD. -Had EGD 03/2011 with negative small bowel biopsies for  celiac disease.  Colonoscopy 04/22/2019 -Nonbleeding internal hemorrhoids -Otherwise normal colonoscopy. -Colon was highly redundant. -Repeat in 10 years.  Earlier, if with any problems. Past Medical History:  Diagnosis Date   ADD (attention deficit disorder)    Allergy    Anemia    past hx    Arthritis    Brown recluse spider bite 02/2022   required IV antibiotics   Chronic insomnia 09/03/2020   Chronic kidney disease    Chronic tension-type headache, not intractable 08/15/2015   Diabetes (HCC)    Diabetes mellitus without complication    Essential tremor 11/30/2012   Excessive daytime sleepiness 09/03/2020   FH: kidney cancer    Generalized anxiety disorder    GERD (gastroesophageal reflux disease)    High cholesterol    History of skin cancer    Hyperlipidemia    Hypertension    Insomnia    Major depressive disorder    Neuromuscular disorder    HH, CTS, neuropathy feet and hands    Obstructive sleep apnea 11/06/2020   Osteoporosis    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 Otherwiser normal EGD   NASAL SEPTUM SURGERY     07/2008 or 07/2010- pt unsure    nephrectomy Right 05/30/2022  TOTAL ABDOMINAL HYSTERECTOMY  10/2004   UPPER GASTROINTESTINAL ENDOSCOPY      Family History  Problem Relation Age of Onset   Lung cancer Mother        Smoker   Alzheimer's disease Mother        early-onset; approximately 27   Diabetes Father    Lung cancer Father        Smoker   Diabetes Sister    Allergic rhinitis Sister    Diabetes Sister    Asthma Sister    Alzheimer's disease Maternal Grandfather    Colon cancer Paternal Grandmother    Esophageal varices Neg  Hx    Liver disease Neg Hx     Social History   Tobacco Use   Smoking status: Never   Smokeless tobacco: Never  Vaping Use   Vaping status: Never Used  Substance Use Topics   Alcohol use: Not Currently   Drug use: Never    Current Outpatient Medications  Medication Sig Dispense Refill   AIRSUPRA 90-80 MCG/ACT AERO Inhale 2 puffs into the lungs as needed (every four to six hours for cough, wheeze, shortness of breath.  Rinse, gargle, and spit after use). 10.7 g 1   aspirin EC 81 MG tablet Take 81 mg by mouth at bedtime.     brexpiprazole (REXULTI) 2 MG TABS tablet Take 2 mg by mouth daily.     Budeson-Glycopyrrol-Formoterol (BREZTRI AEROSPHERE) 160-9-4.8 MCG/ACT AERO Inhale two puffs with spacer twice daily to prevent cough or wheeze.  Rinse, gargle, and spit after use. 10.7 g 5   CALCIUM-VITAMIN D PO Take by mouth daily.     clonazePAM (KLONOPIN) 1 MG tablet Take 1 mg by mouth daily after breakfast.     colesevelam (WELCHOL) 625 MG tablet Take 1,875 mg by mouth 2 (two) times daily with a meal.     Cyanocobalamin (VITAMIN B-12 PO) Take 1,000 mcg by mouth every morning.     DEXTROAMPHETAMINE SULFATE PO Take 20 mg by mouth daily.     DULoxetine (CYMBALTA) 30 MG capsule Take 90 mg by mouth daily.     ezetimibe (ZETIA) 10 MG tablet      famotidine (PEPCID) 40 MG tablet TAKE 1 TABLET AT BEDTIME 90 tablet 3   Ferrous Sulfate (IRON) 325 (65 Fe) MG TABS Take 1 tablet by mouth at bedtime.     fexofenadine (ALLEGRA) 180 MG tablet Can take one tablet by mouth once daily if needed. 30 tablet 5   levothyroxine (SYNTHROID) 125 MCG tablet Take 125 mcg by mouth daily.     metFORMIN (GLUCOPHAGE-XR) 500 MG 24 hr tablet Take 500 mg by mouth 2 (two) times daily.     metoprolol succinate (TOPROL-XL) 50 MG 24 hr tablet Take 50 mg by mouth daily after breakfast. Take with or immediately following a meal.     pantoprazole (PROTONIX) 40 MG tablet TAKE 1 TABLET TWICE A DAY AS DIRECTED 180 tablet 1    pioglitazone (ACTOS) 45 MG tablet Take 45 mg by mouth every morning.     rOPINIRole (REQUIP) 2 MG tablet Take 2 mg by mouth at bedtime.     traZODone (DESYREL) 100 MG tablet Take 150 mg by mouth at bedtime as needed.     Vitamin D, Ergocalciferol, (DRISDOL) 1.25 MG (50000 UNIT) CAPS capsule Take by mouth.     VYVANSE 50 MG capsule Take 50 mg by mouth every morning.     No current facility-administered medications for this visit.  Allergies  Allergen Reactions   Latex Itching    Review of Systems:  Constitutional: Denies fever, chills, diaphoresis, appetite change and fatigue.  HEENT: Denies photophobia, eye pain, redness, hearing loss, ear pain, congestion, sore throat, rhinorrhea, sneezing, mouth sores, neck pain, neck stiffness and tinnitus.   Respiratory: Denies SOB, DOE, cough, chest tightness,  and wheezing.   Cardiovascular: Denies chest pain, palpitations and leg swelling.  Genitourinary: Denies dysuria, urgency, frequency, hematuria, flank pain and difficulty urinating.  Musculoskeletal: Denies myalgias, back pain, joint swelling, arthralgias and gait problem.  Skin: No rash.  Neurological: Denies dizziness, seizures, syncope, weakness, light-headedness, numbness and headaches.  Hematological: Denies adenopathy. Easy bruising, personal or family bleeding history  Psychiatric/Behavioral: No anxiety or depression     Physical Exam:    BP 122/80   Pulse (!) 58   Ht 5' (1.524 m)   Wt 163 lb (73.9 kg)   BMI 31.83 kg/m  Wt Readings from Last 3 Encounters:  05/12/23 163 lb (73.9 kg)  05/06/23 163 lb 9.6 oz (74.2 kg)  01/15/23 163 lb 3.2 oz (74 kg)   Constitutional:  Well-developed, in no acute distress. Psychiatric: Normal mood and affect. Behavior is normal. HEENT: Pupils normal.  Conjunctivae are normal. No scleral icterus. Neck supple.  Cardiovascular: Normal rate, regular rhythm. No edema Pulmonary/chest: Effort normal and breath sounds normal. No wheezing, rales  or rhonchi. Abdominal: Soft, nondistended. Nontender. Bowel sounds active throughout. There are no masses palpable. No hepatomegaly. Rectal: Deferred Neurological: Alert and oriented to person place and time. Skin: Skin is warm and dry. No rashes noted.  Data Reviewed: I have personally reviewed following labs and imaging studies  CBC:     No data to display          CMP:     No data to display          GFR: CrCl cannot be calculated (No successful lab value found.). Liver Function Tests: No results for input(s): "AST", "ALT", "ALKPHOS", "BILITOT", "PROT", "ALBUMIN" in the last 168 hours. No results for input(s): "LIPASE", "AMYLASE" in the last 168 hours. No results for input(s): "AMMONIA" in the last 168 hours. Coagulation Profile: No results for input(s): "INR", "PROTIME" in the last 168 hours. HbA1C: No results for input(s): "HGBA1C" in the last 72 hours. Lipid Profile: No results for input(s): "CHOL", "HDL", "LDLCALC", "TRIG", "CHOLHDL", "LDLDIRECT" in the last 72 hours. Thyroid Function Tests: No results for input(s): "TSH", "T4TOTAL", "FREET4", "T3FREE", "THYROIDAB" in the last 72 hours. Anemia Panel: No results for input(s): "VITAMINB12", "FOLATE", "FERRITIN", "TIBC", "IRON", "RETICCTPCT" in the last 72 hours.  No results found for this or any previous visit (from the past 240 hour(s)).    Radiology Studies: No results found.    Edman Circle, MD 05/12/2023, 9:48 AM  Cc: Abner Greenspan, MD

## 2023-05-22 ENCOUNTER — Other Ambulatory Visit: Payer: Self-pay | Admitting: *Deleted

## 2023-05-22 MED ORDER — FEXOFENADINE HCL 180 MG PO TABS: ORAL_TABLET | ORAL | 1 refills | Status: DC

## 2023-05-22 MED ORDER — BREZTRI AEROSPHERE 160-9-4.8 MCG/ACT IN AERO: INHALATION_SPRAY | RESPIRATORY_TRACT | 1 refills | Status: DC

## 2023-05-29 ENCOUNTER — Ambulatory Visit: Admitting: Psychology

## 2023-06-08 ENCOUNTER — Ambulatory Visit: Admitting: Psychology

## 2023-06-08 DIAGNOSIS — F331 Major depressive disorder, recurrent, moderate: Secondary | ICD-10-CM

## 2023-06-08 DIAGNOSIS — F319 Bipolar disorder, unspecified: Secondary | ICD-10-CM | POA: Diagnosis not present

## 2023-06-08 DIAGNOSIS — F4381 Prolonged grief disorder: Secondary | ICD-10-CM | POA: Diagnosis not present

## 2023-06-08 NOTE — Progress Notes (Signed)
                                                                                            Nancy Harris is a 57 y.o. female patient     Diagnosis Bipolar,  complicated grief, depression Symptoms unspecified Medication Status compliance  Safety none  If Suicidal or Homicidal State Action Taken: unspecified  Current Risk: low Medications See chart Objectives unspecified Client Response full compliance  Service Location Location, [address] [place], [place] [address]  Service Code cpt [id number]  Facilitate problem solving  Normalize/Reframe  Relaxation training  Self-monitoring  Self care activities  Emotion regulation skills  Validate/empathize  Behavioral activation plan  Session notes:  Dx. Bipolar 1,  and Complicated Grief  Meds:Cymbalta (30mg  then increase to 90mg ), Clonazepam 1mg , Wellcall 625mg  (3, twice daily with food), dextroamphetamine 20mg  (1 in afternoon), Trulicity (1 injection/week), Zetia 10mg , Gabapentin 300mg  at bedtime, Metforman 500mg  twice daily, Toprol XL 50mg  in morning, Protonix 40mg  in evening, Actos 45 mg in morning, Rexulti 2 mg in morning, Trazedone 200mg , Vit. D2, Vivance 70mg  every morning, B12 and baby aspirin. Goals/Treatment Plan: Patient reports feeling depressed with symptoms of sadness, despair, hopelessness and helplessness. Struggles to manage daily affairs and live independently. Will utilize behavioral and insight oriented therapy to address symptoms. Needs support to organize herself and develop confidence to make decisions.  Goal Date is 12-25. She also needs help to grieve the loss of her husband. Date to achieve goal is 6-25.  Patient agrees to a video Public affairs consultant) session and understands the limits of this platform. She is at home and provider in his home office.   Nancy Harris is now living in her daughter's basement. She feels isolated and  reluctant to "interfere" by going upstairs to be with daughter and her family. She just accepted an offer to purchase her house and will close sometime soon. This will help her clear her debt and then invest for retirement account. It was difficult for her to sell her home because she had been there 26 years. Daughter wants her to "get rid of" a lot of her belongings and she has already had 2 yard sales. Claims she has filed for her disability (as she had done 4-5 years ago) and hoping to get back pay. Talked about best ways to facilitate her transition to this new living situation. Needs to fight her urge to isolate/withdraw.                            Nancy Harris Time: 5:15p- 6:00p 45 min.

## 2023-06-12 ENCOUNTER — Telehealth: Payer: Self-pay

## 2023-06-12 ENCOUNTER — Other Ambulatory Visit (HOSPITAL_COMMUNITY): Payer: Self-pay

## 2023-06-12 NOTE — Telephone Encounter (Signed)
Pharmacy Patient Advocate Encounter   Received notification from CoverMyMeds that prior authorization for Airsupra 90-80MCG/ACT aerosol is required/requested.   Insurance verification completed.   The patient is insured through [facility] [place] .   Per test claim: PA required; PA submitted to above mentioned insurance via CoverMyMeds Key/confirmation #/EOC [id number] Status is pending

## 2023-06-15 MED ORDER — FLUTICASONE PROPIONATE HFA 44 MCG/ACT IN AERO: INHALATION_SPRAY | RESPIRATORY_TRACT | 1 refills | Status: AC

## 2023-06-15 NOTE — Addendum Note (Signed)
Addended by: Alphonzo Cruise on: 06/15/2023 05:05 PM   Modules accepted: Orders

## 2023-06-15 NOTE — Telephone Encounter (Signed)
Called and informed patient of Dr. Kathyrn Lass message. Sent ERX to [employer].

## 2023-06-15 NOTE — Telephone Encounter (Signed)
Pharmacy Patient Advocate Encounter  Received notification from [facility] that Prior Authorization for Nancy Harris has been DENIED.  Full denial letter will be uploaded to the media tab. See denial reason below.   PA #/Case ID/Reference #:

## 2023-06-17 ENCOUNTER — Ambulatory Visit (INDEPENDENT_AMBULATORY_CARE_PROVIDER_SITE_OTHER): Admitting: Allergy and Immunology

## 2023-06-17 ENCOUNTER — Encounter: Payer: Self-pay | Admitting: Allergy and Immunology

## 2023-06-17 VITALS — BP 114/74 | HR 84 | Resp 12

## 2023-06-17 DIAGNOSIS — J4540 Moderate persistent asthma, uncomplicated: Secondary | ICD-10-CM

## 2023-06-17 DIAGNOSIS — J3089 Other allergic rhinitis: Secondary | ICD-10-CM | POA: Diagnosis not present

## 2023-06-17 DIAGNOSIS — K219 Gastro-esophageal reflux disease without esophagitis: Secondary | ICD-10-CM

## 2023-06-17 NOTE — Patient Instructions (Addendum)
  1.  Continue Breztri -2 inhalations twice a day w/spacer   2.  Continue to Treat reflux / LPR with the following:  A. Pantoprazole 40 mg 2 times a day + famotidine 40 mg in PM B. Attempt to decrease size of evening meal and try to eat earlier in evening C. Replace throat clearing with swallowing/drinking maneuver  3. If needed:   A. Albuterol + Fluticasone 44 - 2 inhalations TOGETHER every 4-6 hours    B. OTC antihistamines  4. Return to clinic 6 months or earlier if needed  5. Influenza = tamiflu, covid = Paxlovid

## 2023-06-17 NOTE — Progress Notes (Unsigned)
[place] [facility] [place] [facility] [place] - [place] - [place]   Follow-up Note  Referring Provider: Abner Greenspan, MD Primary Provider: Abner Greenspan, MD Date of Office Visit: 06/17/2023  Subjective:   Nancy Harris (DOB: 09-Feb-1966) is a 57 y.o. female who [facility] in re-evaluation of the following:  HPI: [name] returns to this clinic in evaluation of asthma, reflux, nasal ulcer.  I last saw her in this clinic 07 February 2023.  During her last visit she had a nasal ulcer and we gave her Bactroban and she apparently has not been having any problems with her nose at this point in time.  She does not use any nasal steroids for this gives rise to epistaxis and occasionally she does get some nasal congestion and some stuffiness.  Her asthma has been under very good control while using Breztri twice a day.  Sometimes she has noticed that in between her doses of Breztri she might get a little bit of a cough and when she does use her Markus Daft it does resolve this cough.  She thinks that her reflux is under good control although she has been a little bit hoarse and having some throat clearing and she has also had some headache over the course of the past 3 days.  She has had no other associated systemic or constitutional symptoms.  She has a follow-up appointment with Dr. Chales Abrahams, GI, and it sounds as though she may be having a manometry performed sometime in the future.  She has received this years flu vaccine.  Allergies as of 06/17/2023       Reactions   Latex Itching        Medication List    Airsupra 90-80 MCG/ACT Aero Generic drug: Albuterol-Budesonide Inhale 2 puffs into the lungs as needed (every four to six hours for cough, wheeze, shortness of breath.  Rinse, gargle, and spit after use).   aspirin EC 81 MG tablet Take 81 mg by mouth at bedtime.   brexpiprazole 2 MG Tabs tablet Commonly known as: REXULTI Take 2 mg by mouth  daily.   Breztri Aerosphere 160-9-4.8 MCG/ACT Aero Generic drug: Budeson-Glycopyrrol-Formoterol Inhale two puffs with spacer twice daily to prevent cough or wheeze.  Rinse, gargle, and spit after use.   CALCIUM-VITAMIN D PO Take by mouth daily.   clonazePAM 1 MG tablet Commonly known as: KLONOPIN Take 1 mg by mouth daily after breakfast.   colesevelam 625 MG tablet Commonly known as: WELCHOL Take 1,875 mg by mouth 2 (two) times daily with a meal.   DEXTROAMPHETAMINE SULFATE PO Take 20 mg by mouth daily.   DULoxetine 30 MG capsule Commonly known as: CYMBALTA Take 90 mg by mouth daily.   ezetimibe 10 MG tablet Commonly known as: ZETIA   famotidine 40 MG tablet Commonly known as: PEPCID TAKE 1 TABLET AT BEDTIME   fexofenadine 180 MG tablet Commonly known as: ALLEGRA Can take one tablet by mouth once daily if needed.   fluticasone 44 MCG/ACT inhaler Commonly known as: FLOVENT HFA Can use two puffs along with Albuterol every six hours as needed for cough, wheeze, shortness of breath.  Rinse, gargle, and spit after use.   Iron 325 (65 Fe) MG Tabs Take 1 tablet by mouth at bedtime.   levothyroxine 125 MCG tablet Commonly known as: SYNTHROID Take 125 mcg by mouth daily.   metFORMIN 500 MG 24 hr tablet Commonly known as: GLUCOPHAGE-XR Take 500 mg by mouth  2 (two) times daily.   metoprolol succinate 50 MG 24 hr tablet Commonly known as: TOPROL-XL Take 50 mg by mouth daily after breakfast. Take with or immediately following a meal.   pantoprazole 40 MG tablet Commonly known as: PROTONIX TAKE 1 TABLET TWICE A DAY AS DIRECTED   pioglitazone 45 MG tablet Commonly known as: ACTOS Take 45 mg by mouth every morning.   rOPINIRole 2 MG tablet Commonly known as: REQUIP Take 2 mg by mouth at bedtime.   traZODone 100 MG tablet Commonly known as: DESYREL Take 150 mg by mouth at bedtime as needed.   VITAMIN B-12 PO Take 1,000 mcg by mouth every morning.   Vitamin D  (Ergocalciferol) 1.25 MG (50000 UNIT) Caps capsule Commonly known as: DRISDOL Take by mouth.   Vyvanse 50 MG capsule Generic drug: lisdexamfetamine Take 50 mg by mouth every morning.    Past Medical History:  Diagnosis Date   ADD (attention deficit disorder)    Allergy    Anemia    past hx    Arthritis    Brown recluse spider bite 02/2022   required IV antibiotics   Chronic insomnia 09/03/2020   Chronic kidney disease    Chronic tension-type headache, not intractable 08/15/2015   Diabetes (HCC)    Diabetes mellitus without complication    Essential tremor 11/30/2012   Excessive daytime sleepiness 09/03/2020   FH: kidney cancer    Generalized anxiety disorder    GERD (gastroesophageal reflux disease)    High cholesterol    History of skin cancer    Hyperlipidemia    Hypertension    Insomnia    Major depressive disorder    Neuromuscular disorder    HH, CTS, neuropathy feet and hands    Obstructive sleep apnea 11/06/2020   Osteoporosis    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 Otherwiser normal EGD   NASAL SEPTUM SURGERY     07/2008 or 07/2010- pt unsure    nephrectomy Right 05/30/2022   TOTAL ABDOMINAL HYSTERECTOMY  10/2004   UPPER GASTROINTESTINAL ENDOSCOPY      Review of systems negative except as noted in HPI / PMHx or noted below:  Review of Systems  Constitutional: Negative.   HENT: Negative.    Eyes: Negative.   Respiratory: Negative.    Cardiovascular: Negative.   Gastrointestinal: Negative.   Genitourinary: Negative.   Musculoskeletal: Negative.   Skin: Negative.   Neurological: Negative.   Endo/Heme/Allergies: Negative.    Psychiatric/Behavioral: Negative.       Objective:   Vitals:   06/17/23 1639  BP: 114/74  Pulse: 84  Resp: 12  SpO2: 98%          Physical Exam Constitutional:      Appearance: She is not diaphoretic.  HENT:     Head: Normocephalic.     Right Ear: Tympanic membrane, ear canal and external ear normal.     Left Ear: Tympanic membrane, ear canal and external ear normal.     Nose: Nose normal. No mucosal edema (no nasal ulceration) or rhinorrhea.     Mouth/Throat:     Pharynx: Uvula midline. No oropharyngeal exudate.  Eyes:     Conjunctiva/sclera: Conjunctivae normal.  Neck:     Thyroid: No thyromegaly.     Trachea: Trachea normal. No tracheal tenderness or tracheal deviation.  Cardiovascular:     Rate and Rhythm: Normal rate and regular rhythm.     Heart sounds: Normal heart sounds, S1 normal and S2 normal. No murmur heard. Pulmonary:     Effort: No respiratory distress.     Breath sounds: Normal breath sounds. No stridor. No wheezing or rales.  Lymphadenopathy:     Head:     Right side of head: No tonsillar adenopathy.     Left side of head: No tonsillar adenopathy.     Cervical: No cervical adenopathy.  Skin:    Findings: No erythema or rash.     Nails: There is no clubbing.  Neurological:     Mental Status: She is alert.     Diagnostics: Spirometry was performed and demonstrated an FEV1 of 1.91 at 85 % of predicted.   Assessment and Plan:   1. Asthma, moderate persistent, well-controlled   2. Perennial allergic rhinitis   3. LPRD (laryngopharyngeal reflux disease)    1.  Continue Breztri -2 inhalations twice a day w/spacer   2.  Continue to Treat reflux / LPR with the following:  A. Pantoprazole 40 mg 2 times a day + famotidine 40 mg in PM B. Attempt to decrease size of evening meal and try to eat earlier in evening C. Replace throat clearing with swallowing/drinking maneuver  3. If needed:   A. Albuterol + Fluticasone 44 - 2 inhalations TOGETHER  every 4-6 hours    B. OTC antihistamines  4. Return to clinic 6 months or earlier if needed  5. Influenza = tamiflu, covid = Paxlovid  [name] appears to be doing pretty well regarding her airway issue on her current plan.  Her reflux also appears to be somewhat stable.  I am not sure if her throat clearing and hoarseness that developed in the past several days is because of her reflux but I did have a talk with her today about replacing throat clearing with swallowing or drinking maneuver.  The fact that she has also has of a low-grade headache over the course the past several days may suggest that she has contracted a viral respiratory tract infection along with this throat issue.  Will just see how things go over the course of the next week or so before moving forward with any further evaluation and treatment.  If she does well I will see her back in this clinic in 6 months.  Laurette Schimke, MD Allergy / Immunology [facility]

## 2023-06-18 ENCOUNTER — Encounter: Payer: Self-pay | Admitting: Allergy and Immunology

## 2023-06-22 ENCOUNTER — Ambulatory Visit: Admitting: Psychology

## 2023-06-24 ENCOUNTER — Encounter: Payer: Self-pay | Admitting: Allergy and Immunology

## 2023-07-14 ENCOUNTER — Encounter: Payer: Self-pay | Admitting: Gastroenterology

## 2023-07-15 ENCOUNTER — Encounter: Payer: Self-pay | Admitting: *Deleted

## 2023-07-20 ENCOUNTER — Ambulatory Visit: Payer: Medicaid Other | Admitting: Psychology

## 2023-07-20 DIAGNOSIS — F331 Major depressive disorder, recurrent, moderate: Secondary | ICD-10-CM

## 2023-07-20 DIAGNOSIS — F4381 Prolonged grief disorder: Secondary | ICD-10-CM

## 2023-07-20 DIAGNOSIS — F319 Bipolar disorder, unspecified: Secondary | ICD-10-CM

## 2023-07-20 NOTE — Progress Notes (Signed)
                                                                                                           Nancy Harris is a 58 y.o. female patient     Diagnosis Bipolar,  complicated grief, depression Symptoms unspecified Medication Status compliance  Safety none  If Suicidal or Homicidal State Action Taken: unspecified  Current Risk: low Medications See chart Objectives unspecified Client Response full compliance  Service Location Location, [address] [place], [place] [address]  Service Code cpt [id number]  Facilitate problem solving  Normalize/Reframe  Relaxation training  Self-monitoring  Self care activities  Emotion regulation skills  Validate/empathize  Behavioral activation plan  Session notes:  Dx. Bipolar 1,  and Complicated Grief  Meds:Cymbalta (30mg  then increase to 90mg ), Clonazepam 1mg , Wellcall 625mg  (3, twice daily with food), dextroamphetamine 20mg  (1 in afternoon), Trulicity (1 injection/week), Zetia 10mg , Gabapentin 300mg  at bedtime, Metforman 500mg  twice daily, Toprol XL 50mg  in morning, Protonix 40mg  in evening, Actos 45 mg in morning, Rexulti 2 mg in morning, Trazedone 200mg , Vit. D2, Vivance 70mg  every morning, B12 and baby aspirin. Goals/Treatment Plan: Patient reports feeling depressed with symptoms of sadness, despair, hopelessness and helplessness. Struggles to manage daily affairs and live independently. Will utilize behavioral and insight oriented therapy to address symptoms. Needs support to organize herself and develop confidence to make decisions.  Goal Date is 12-25. She also needs help to grieve the loss of her husband. Date to achieve goal is 6-25.  Patient agrees to a video Public affairs consultant) session and understands the limits of this platform. She is at home and provider in his home office.   Nancy Harris says that "it is okay" living at her  daughter's home. She closed on her last Thursday and said "I'm glad it's over". She has applied for a job at [employer] but not heard anything. She is also trying to get more involved in activities and is looking at the [facility] senior  adults calendar. This will be good for her to get more involved with other adults and expand network. She is in physical theray 2 times a week and feels it is helping her gain strength.                               Nancy Harris Time: 5:15p- 6:00p 45 min.

## 2023-07-28 ENCOUNTER — Encounter: Admitting: Gastroenterology

## 2023-08-12 ENCOUNTER — Encounter: Payer: Self-pay | Admitting: Gastroenterology

## 2023-08-12 ENCOUNTER — Ambulatory Visit (AMBULATORY_SURGERY_CENTER): Payer: Medicaid Other | Admitting: Gastroenterology

## 2023-08-12 VITALS — BP 106/74 | HR 66 | Temp 97.3°F | Resp 12 | Wt 163.0 lb

## 2023-08-12 DIAGNOSIS — K449 Diaphragmatic hernia without obstruction or gangrene: Secondary | ICD-10-CM

## 2023-08-12 DIAGNOSIS — K219 Gastro-esophageal reflux disease without esophagitis: Secondary | ICD-10-CM

## 2023-08-12 MED ORDER — SODIUM CHLORIDE 0.9 % IV SOLN: 500.0000 mL | INTRAVENOUS | Status: DC

## 2023-08-12 NOTE — Progress Notes (Signed)
Pt A/O x 3, gd SR's, pleased with anesthesia, report to RN

## 2023-08-12 NOTE — Progress Notes (Deleted)
Vitals-DT  Pt's states no medical or surgical changes since previsit or office visit.

## 2023-08-12 NOTE — Progress Notes (Signed)
Called to room to assist during endoscopic procedure.  Patient ID and intended procedure confirmed with present staff. Received instructions for my participation in the procedure from the performing physician.

## 2023-08-12 NOTE — Progress Notes (Signed)
Chief Complaint: For further eval  Referring Provider:  Abner Greenspan, MD      ASSESSMENT AND PLAN;   #1. Longstanding GERD despite PPIs. Assoc asthma, LPR  #2. Chronic constipation  Plan: -Continue protonix 40mg  po BID -Continue pepcid 20mg  po at bedtime -EGD for further evaluation -If still with problems GES followed by evaluation for TIF/Nissen's. -Nonpharmacologic means of reflux control. -Please resume MiraLAX 17 g p.o. daily   HPI:    Nancy Harris is a 58 y.o. female  With multiple medical problems including DM2 (with mild neuropathy), IBS, anxiety/depression, HTN, HLD, CKD, history of RCC s/p right nephrectomy, OSA with chronic insomnia  Also with asthma/reflux contributing to cough Seen here on advice of Dr. Lucie Leather  With continued postprandial regurgitation, reflux, waterbrash and x yrs, most prominently over last 2 years. Denies having any odynophagia, dysphagia, nausea, vomiting.  Heartburn is better (but not fully controlled) with twice daily Protonix and Pepcid nightly.  Also with nocturnal regurgitation.  Somewhat better after adding Pepcid at night.  Here for consideration for lap Nissen's (See Dr Kathyrn Lass note)  Other option would be TIF  I have discussed extensively.  Would proceed with EGD, then GES followed by evaluation for TIF if continued problems.  Asthma is under better control currently.  She also has laryngopharyngeal reflux and would also like to get off PPIs due to history of osteoporosis  Continues to have chronic constipation, better when she takes MiraLAX.  No recent weight loss.  Diabetes under good control with hemoglobin A1c 5.2   Wt Readings from Last 3 Encounters:  08/12/23 163 lb (73.9 kg)  05/12/23 163 lb (73.9 kg)  05/06/23 163 lb 9.6 oz (74.2 kg)   Past GI history:  EGD 08/2015 -Mild gastritis. Bx- neg for HP -Retained food -Otherwise normal EGD. -Had EGD 03/2011 with negative small bowel biopsies for celiac  disease.  Colonoscopy 04/22/2019 -Nonbleeding internal hemorrhoids -Otherwise normal colonoscopy. -Colon was highly redundant. -Repeat in 10 years.  Earlier, if with any problems. Past Medical History:  Diagnosis Date   ADD (attention deficit disorder)    Allergy    Anemia    past hx    Arthritis    Brown recluse spider bite 02/2022   required IV antibiotics   Chronic insomnia 09/03/2020   Chronic kidney disease    Chronic tension-type headache, not intractable 08/15/2015   Diabetes (HCC)    Diabetes mellitus without complication    Essential tremor 11/30/2012   Excessive daytime sleepiness 09/03/2020   FH: kidney cancer    Generalized anxiety disorder    GERD (gastroesophageal reflux disease)    High cholesterol    History of skin cancer    Hyperlipidemia    Hypertension    Insomnia    Major depressive disorder    Neuromuscular disorder    HH, CTS, neuropathy feet and hands    Obstructive sleep apnea 11/06/2020   Osteoporosis    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 Otherwiser normal EGD   NASAL SEPTUM SURGERY     07/2008 or 07/2010- pt unsure    nephrectomy Right 05/30/2022   TOTAL ABDOMINAL  HYSTERECTOMY  10/2004   UPPER GASTROINTESTINAL ENDOSCOPY      Family History  Problem Relation Age of Onset   Lung cancer Mother        Smoker   Alzheimer's disease Mother        early-onset; approximately 62   Diabetes Father    Lung cancer Father        Smoker   Diabetes Sister    Allergic rhinitis Sister    Diabetes Sister    Asthma Sister    Alzheimer's disease Maternal Grandfather    Colon cancer Paternal Grandmother    Esophageal varices Neg Hx     Liver disease Neg Hx     Social History   Tobacco Use   Smoking status: Never   Smokeless tobacco: Never  Vaping Use   Vaping status: Never Used  Substance Use Topics   Alcohol use: Not Currently   Drug use: Never    Current Outpatient Medications  Medication Sig Dispense Refill   AIRSUPRA 90-80 MCG/ACT AERO Inhale 2 puffs into the lungs as needed (every four to six hours for cough, wheeze, shortness of breath.  Rinse, gargle, and spit after use). 10.7 g 1   aspirin EC 81 MG tablet Take 81 mg by mouth at bedtime.     brexpiprazole (REXULTI) 2 MG TABS tablet Take 2 mg by mouth daily.     Budeson-Glycopyrrol-Formoterol (BREZTRI AEROSPHERE) 160-9-4.8 MCG/ACT AERO Inhale two puffs with spacer twice daily to prevent cough or wheeze.  Rinse, gargle, and spit after use. 3 each 1   CALCIUM-VITAMIN D PO Take by mouth daily.     clonazePAM (KLONOPIN) 1 MG tablet Take 1 mg by mouth daily after breakfast.     colesevelam (WELCHOL) 625 MG tablet Take 1,875 mg by mouth 2 (two) times daily with a meal.     Cyanocobalamin (VITAMIN B-12 PO) Take 1,000 mcg by mouth every morning.     DEXTROAMPHETAMINE SULFATE PO Take 20 mg by mouth daily.     DULoxetine (CYMBALTA) 30 MG capsule Take 90 mg by mouth daily.     ezetimibe (ZETIA) 10 MG tablet      famotidine (PEPCID) 40 MG tablet TAKE 1 TABLET AT BEDTIME 90 tablet 3   Ferrous Sulfate (IRON) 325 (65 Fe) MG TABS Take 1 tablet by mouth at bedtime.     fexofenadine (ALLEGRA) 180 MG tablet Can take one tablet by mouth once daily if needed. 90 tablet 1   fluticasone (FLOVENT HFA) 44 MCG/ACT inhaler Can use two puffs along with Albuterol every six hours as needed for cough, wheeze, shortness of breath.  Rinse, gargle, and spit after use. 10.6 g 1   levothyroxine (SYNTHROID) 125 MCG tablet Take 125 mcg by mouth daily.     metFORMIN (GLUCOPHAGE-XR) 500 MG 24 hr tablet Take 500 mg by mouth 2 (two) times daily.     metoprolol succinate (TOPROL-XL) 50 MG 24 hr  tablet Take 50 mg by mouth daily after breakfast. Take with or immediately following a meal.     pantoprazole (PROTONIX) 40 MG tablet TAKE 1 TABLET TWICE A DAY AS DIRECTED 180 tablet 1   pioglitazone (ACTOS) 45 MG tablet Take 45 mg by mouth every morning.     rOPINIRole (REQUIP) 2 MG tablet Take 2 mg by mouth at bedtime.     traZODone (DESYREL) 100 MG tablet Take 150 mg by mouth at bedtime as needed.     Vitamin D, Ergocalciferol, (DRISDOL) 1.25 MG (50000  UNIT) CAPS capsule Take by mouth.     VYVANSE 50 MG capsule Take 50 mg by mouth every morning.     Current Facility-Administered Medications  Medication Dose Route Frequency Provider Last Rate Last Admin   0.9 %  sodium chloride infusion  500 mL Intravenous Continuous Lynann Bologna, MD        Allergies  Allergen Reactions   Latex Itching    Review of Systems:  Constitutional: Denies fever, chills, diaphoresis, appetite change and fatigue.  HEENT: Denies photophobia, eye pain, redness, hearing loss, ear pain, congestion, sore throat, rhinorrhea, sneezing, mouth sores, neck pain, neck stiffness and tinnitus.   Respiratory: Denies SOB, DOE, cough, chest tightness,  and wheezing.   Cardiovascular: Denies chest pain, palpitations and leg swelling.  Genitourinary: Denies dysuria, urgency, frequency, hematuria, flank pain and difficulty urinating.  Musculoskeletal: Denies myalgias, back pain, joint swelling, arthralgias and gait problem.  Skin: No rash.  Neurological: Denies dizziness, seizures, syncope, weakness, light-headedness, numbness and headaches.  Hematological: Denies adenopathy. Easy bruising, personal or family bleeding history  Psychiatric/Behavioral: No anxiety or depression     Physical Exam:    BP 110/67 (Cuff Size: Normal)   Pulse 75   Temp (!) 97.3 F (36.3 C) (Temporal)   Wt 163 lb (73.9 kg)   SpO2 97%   BMI 31.83 kg/m  Wt Readings from Last 3 Encounters:  08/12/23 163 lb (73.9 kg)  05/12/23 163 lb (73.9 kg)   05/06/23 163 lb 9.6 oz (74.2 kg)   Constitutional:  Well-developed, in no acute distress. Psychiatric: Normal mood and affect. Behavior is normal. HEENT: Pupils normal.  Conjunctivae are normal. No scleral icterus. Neck supple.  Cardiovascular: Normal rate, regular rhythm. No edema Pulmonary/chest: Effort normal and breath sounds normal. No wheezing, rales or rhonchi. Abdominal: Soft, nondistended. Nontender. Bowel sounds active throughout. There are no masses palpable. No hepatomegaly. Rectal: Deferred Neurological: Alert and oriented to person place and time. Skin: Skin is warm and dry. No rashes noted.  Data Reviewed: I have personally reviewed following labs and imaging studies      Edman Circle, MD 08/12/2023, 8:22 AM  Cc: Abner Greenspan, MD

## 2023-08-12 NOTE — Patient Instructions (Addendum)
Resume your Normal Medications. The office will call you to schedule the Stomach study. You will need to schedule a follow up appt in 8-12 weeks ( make after Stomach study)     Hiatal Hernia  A hiatal hernia occurs when part of the stomach slides above the muscle that separates the abdomen from the chest (diaphragm). A person can be born with a hiatal hernia (congenital), or it may develop over time. In almost all cases of hiatal hernia, only the top part of the stomach pushes through the diaphragm. Many people have a hiatal hernia with no symptoms. The larger the hernia, the more likely it is that you will have symptoms. In some cases, a hiatal hernia allows stomach acid to flow back into the tube that carries food from your mouth to your stomach (esophagus). This may cause heartburn symptoms. The development of heartburn symptoms may mean that you have a condition called gastroesophageal reflux disease (GERD). What are the causes? This condition is caused by a weakness in the opening (hiatus) where the esophagus passes through the diaphragm to attach to the upper part of the stomach. A person may be born with a weakness in the hiatus, or a weakness can develop over time. What increases the risk? This condition is more likely to develop in: Older people. Age is a major risk factor for a hiatal hernia, especially if you are over the age of 43. Pregnant women. People who are overweight. People who have frequent constipation. What are the signs or symptoms? Symptoms of this condition usually develop in the form of GERD symptoms. Symptoms include: Heartburn. Upset stomach (indigestion). Trouble swallowing. Coughing or wheezing. Wheezing is making high-pitched whistling sounds when you breathe. Sore throat. Chest pain. Nausea and vomiting. How is this diagnosed? This condition may be diagnosed during testing for GERD. Tests that may be done include: X-rays of your stomach or chest. An  upper gastrointestinal (GI) series. This is an X-ray exam of your GI tract that is taken after you swallow a chalky liquid that shows up clearly on the X-ray. Endoscopy. This is a procedure to look into your stomach using a thin, flexible tube that has a tiny camera and light on the end of it. How is this treated? This condition may be treated by: Dietary and lifestyle changes to help reduce GERD symptoms. Medicines. These may include: Over-the-counter antacids. Medicines that make your stomach empty more quickly. Medicines that block the production of stomach acid (H2 blockers). Stronger medicines to reduce stomach acid (proton pump inhibitors). Surgery to repair the hernia, if other treatments are not helping. If you have no symptoms, you may not need treatment. Follow these instructions at home: Lifestyle and activity Do not use any products that contain nicotine or tobacco. These products include cigarettes, chewing tobacco, and vaping devices, such as e-cigarettes. If you need help quitting, ask your health care provider. Try to achieve and maintain a healthy body weight. Avoid putting pressure on your abdomen. Anything that puts pressure on your abdomen increases the amount of acid that may be pushed up into your esophagus. Avoid bending over, especially after eating. Raise the head of your bed by putting blocks under the legs. This keeps your head and esophagus higher than your stomach. Do not wear tight clothing around your chest or stomach. Try not to strain when having a bowel movement, when urinating, or when lifting heavy objects. Eating and drinking Avoid foods that can worsen GERD symptoms. These may include: Fatty  foods, like fried foods. Citrus fruits, like oranges or lemon. Other foods and drinks that contain acid, like orange juice or tomatoes. Spicy food. Chocolate. Eat frequent small meals instead of three large meals a day. This helps prevent your stomach from getting  too full. Eat slowly. Do not lie down right after eating. Do not eat 1-2 hours before bed. Do not drink beverages with caffeine. These include cola, coffee, cocoa, and tea. Do not drink alcohol. General instructions Take over-the-counter and prescription medicines only as told by your health care provider. Keep all follow-up visits. Your health care provider will want to check that any new prescribed medicines are helping your symptoms. Contact a health care provider if: Your symptoms are not controlled with medicines or lifestyle changes. You are having trouble swallowing. You have coughing or wheezing that will not go away. Your pain is getting worse. Your pain spreads to your arms, neck, jaw, teeth, or back. You feel nauseous or you vomit. Get help right away if: You have shortness of breath. You vomit blood. You have bright red blood in your stools. You have black, tarry stools. These symptoms may be an emergency. Get help right away. Call 911. Do not wait to see if the symptoms will go away. Do not drive yourself to the hospital. Summary A hiatal hernia occurs when part of the stomach slides above the muscle that separates the abdomen from the chest. A person may be born with a weakness in the hiatus, or a weakness can develop over time. Symptoms of a hiatal hernia may include heartburn, trouble swallowing, or sore throat. Management of a hiatal hernia includes eating frequent small meals instead of three large meals a day. Get help right away if you vomit blood, have bright red blood in your stools, or have black, tarry stools. This information is not intended to replace advice given to you by your health care provider. Make sure you discuss any questions you have with your health care provider. YOU HAD AN ENDOSCOPIC PROCEDURE TODAY AT THE [facility]:   Refer to the procedure report that was given to you for any specific questions about what was found during the  examination.  If the procedure report does not answer your questions, please call your gastroenterologist to clarify.  If you requested that your care partner not be given the details of your procedure findings, then the procedure report has been included in a sealed envelope for you to review at your convenience later.  YOU SHOULD EXPECT: Some feelings of bloating in the abdomen. Passage of more gas than usual.  Walking can help get rid of the air that was put into your GI tract during the procedure and reduce the bloating. If you had a lower endoscopy (such as a colonoscopy or flexible sigmoidoscopy) you may notice spotting of blood in your stool or on the toilet paper. If you underwent a bowel prep for your procedure, you may not have a normal bowel movement for a few days.  Please Note:  You might notice some irritation and congestion in your nose or some drainage.  This is from the oxygen used during your procedure.  There is no need for concern and it should clear up in a day or so.  SYMPTOMS TO REPORT IMMEDIATELY:   Following upper endoscopy (EGD)  Vomiting of blood or coffee ground material  New chest pain or pain under the shoulder blades  Painful or persistently difficult swallowing  New shortness of  breath  Fever of 100F or higher  Black, tarry-looking stools  For urgent or emergent issues, a gastroenterologist can be reached at any hour by calling [phone]. Do not use MyChart messaging for urgent concerns.    DIET:  We do recommend a small meal at first, but then you may proceed to your regular diet.  Drink plenty of fluids but you should avoid alcoholic beverages for 24 hours.  ACTIVITY:  You should plan to take it easy for the rest of today and you should NOT DRIVE or use heavy machinery until tomorrow (because of the sedation medicines used during the test).    FOLLOW UP: Our staff will call the number listed on your records the next business day following your  procedure.  We will call around 7:15- 8:00 am to check on you and address any questions or concerns that you may have regarding the information given to you following your procedure. If we do not reach you, we will leave a message.     If any biopsies were taken you will be contacted by phone or by letter within the next 1-3 weeks.  Please call us at [phone] if you have not heard about the biopsies in 3 weeks.    SIGNATURES/CONFIDENTIALITY: You and/or your care partner have signed paperwork which will be entered into your electronic medical record.  These signatures attest to the fact that that the information above on your After Visit Summary has been reviewed and is understood.  Full responsibility of the confidentiality of this discharge information lies with you and/or your care-partner.   Document Revised: 08/13/2021 Document Reviewed: 08/13/2021 Elsevier Patient Education  2024 [employer].

## 2023-08-12 NOTE — Progress Notes (Signed)
Pt's states no medical or surgical changes since previsit or office visit.

## 2023-08-12 NOTE — Op Note (Signed)
[facility] Patient Name: Nancy Harris Procedure Date: 08/12/2023 8:32 AM MRN: [id number] Endoscopist: Lynann Bologna , MD, [id number] Age: 58 Referring MD:  Date of Birth: [birth year] Gender: Female Account #: [id number] Procedure:                Upper GI endoscopy Indications:              Refractory GERD. Medicines:                Monitored Anesthesia Care Procedure:                Pre-Anesthesia Assessment:                           - Prior to the procedure, a History and Physical                            was performed, and patient medications and                            allergies were reviewed. The patient's tolerance of                            previous anesthesia was also reviewed. The risks                            and benefits of the procedure and the sedation                            options and risks were discussed with the patient.                            All questions were answered, and informed consent                            was obtained. Prior Anticoagulants: The patient has                            taken no anticoagulant or antiplatelet agents. ASA                            Grade Assessment: III - A patient with severe                            systemic disease. After reviewing the risks and                            benefits, the patient was deemed in satisfactory                            condition to undergo the procedure.                           After obtaining informed consent, the endoscope was  passed under direct vision. Throughout the                            procedure, the patient's blood pressure, pulse, and                            oxygen saturations were monitored continuously. The                            Olympus Scope [id number] was introduced through the                            mouth, and advanced to the second part of duodenum.                            The upper GI endoscopy was  accomplished without                            difficulty. The patient tolerated the procedure                            well. Scope In: Scope Out: Findings:                 The examined esophagus was normal with well defined                            Z-line at 34 cm. Biopsies were obtained from the                            proximal and mid esophagus with cold forceps for                            histology to r/o eosinophilic esophagitis. Multiple                            Bx were obtained from distal esophagus as well (jar                            2)                           A 1 cm hiatal hernia was present. Best visualized                            on valsalva.                           A medium amount of food (residue) was found in the                            gastric body. No outlet obstruction.                           The examined duodenum was normal. Complications:  No immediate complications. Estimated Blood Loss:     Estimated blood loss: none. Impression:               - Small transient hiatal hernia.                           - A medium amount of food (residue) in the stomach                            without outlet obstruction                           - Normal examined duodenum.                           - Biopsies were taken with a cold forceps for                            evaluation of eosinophilic esophagitis. Recommendation:           - Patient has a contact number available for                            emergencies. The signs and symptoms of potential                            delayed complications were discussed with the                            patient. Return to normal activities tomorrow.                            Written discharge instructions were provided to the                            patient.                           - Resume previous diet.                           - Continue present medications.                            - GES to r/o gastroparesis                           - GERD brochures                           - Await pathology results.                           - FU in 8-12 weeks.                           - The findings and recommendations were discussed  with the patient's family. Lynann Bologna, MD 08/12/2023 8:50:58 AM This report has been signed electronically.

## 2023-08-13 ENCOUNTER — Telehealth: Payer: Self-pay

## 2023-08-13 NOTE — Telephone Encounter (Signed)
  Follow up Call-     08/12/2023    8:19 AM  Call back number  Post procedure Call Back phone  # [phone]  Permission to leave phone message Yes     Patient questions:  Do you have a fever, pain , or abdominal swelling? No. Pain Score  3* Pt states some swelling in her throat and some jaw pain. Informed patient she may take tylenol for pain and to call if pain worsened or did not go away.   Have you tolerated food without any problems? Yes.    Have you been able to return to your normal activities? Yes.    Do you have any questions about your discharge instructions: Diet   No. Medications  No. Follow up visit  No.  Do you have questions or concerns about your Care? No.  Actions: * If pain score is 4 or above: No action needed, pain <4.

## 2023-08-13 NOTE — Telephone Encounter (Signed)
LVM for patient to call back to see about scheduling a follow up appointment for 8-12 weeks after her procedure.

## 2023-08-17 LAB — SURGICAL PATHOLOGY

## 2023-08-17 NOTE — Telephone Encounter (Signed)
Appt made for 4-23 at 1120 and told her to call back if she needs to reschedule and she voiced understanding

## 2023-08-20 ENCOUNTER — Encounter: Payer: Self-pay | Admitting: Gastroenterology

## 2023-09-09 ENCOUNTER — Ambulatory Visit: Payer: Medicaid Other | Admitting: Psychology

## 2023-09-09 DIAGNOSIS — F319 Bipolar disorder, unspecified: Secondary | ICD-10-CM | POA: Diagnosis not present

## 2023-09-09 DIAGNOSIS — F4381 Prolonged grief disorder: Secondary | ICD-10-CM

## 2023-09-09 DIAGNOSIS — F331 Major depressive disorder, recurrent, moderate: Secondary | ICD-10-CM

## 2023-09-09 NOTE — Progress Notes (Signed)
                                                                                                                          Nancy Harris is a 58 y.o. female patient     Diagnosis Bipolar,  complicated grief, depression Symptoms unspecified Medication Status compliance  Safety none  If Suicidal or Homicidal State Action Taken: unspecified  Current Risk: low Medications See chart Objectives unspecified Client Response full compliance  Service Location Location, [address] [place], [place] [address]  Service Code cpt [phone]  Facilitate problem solving  Normalize/Reframe  Relaxation training  Self-monitoring  Self care activities  Emotion regulation skills  Validate/empathize  Behavioral activation plan  Session notes:  Dx. Bipolar 1,  and Complicated Grief  Meds:Cymbalta (30mg  then increase to 90mg ), Clonazepam 1mg , Wellcall 625mg  (3, twice daily with food), dextroamphetamine 20mg  (1 in afternoon), Trulicity (1 injection/week), Zetia 10mg , Gabapentin 300mg  at bedtime, Metforman 500mg  twice daily, Toprol XL 50mg  in morning, Protonix 40mg  in evening, Actos 45 mg in morning, Rexulti 2 mg in morning, Trazedone 200mg , Vit. D2, Vivance 70mg  every morning, B12 and baby aspirin. Goals/Treatment Plan: Patient reports feeling depressed with symptoms of sadness, despair, hopelessness and helplessness. Struggles to manage daily affairs and live independently. Will utilize behavioral and insight oriented therapy to address symptoms. Needs support to organize herself and develop confidence to make decisions.  Goal Date is 12-25. She also needs help to grieve the loss of her husband. Date to achieve goal is 6-25.  Patient agrees to a video Public affairs consultant) session and understands the limits of this platform. She is at home and provider in his home office.   Nancy Harris says she  thinks she hurt her back again by overdoing it with exercise. Pain is in lower back. Suggested that she contact her doctor and get checked out. She has been gaining weight while living at daughter's house. Talked about what she needs to do to lose the weight, which is not helping her back problems. Has been having some depressive feelings and is often fatigued and has sleep disturbance (insomnia and hypersomnia). She does say "I don't have that much to be depressed about lately". We also discussed if she needs to adjust sleeping meds.                               Nancy Harris Time: 3:10p- 4:00p 50 min.

## 2023-09-13 ENCOUNTER — Other Ambulatory Visit: Payer: Self-pay | Admitting: Allergy and Immunology

## 2023-09-26 IMAGING — MG MM DIGITAL SCREENING BILAT W/ TOMO AND CAD
8 of 14 series · 8 of 40 positions shown · non-contrast
Comparison: Previous exam(s).

CLINICAL DATA: Screening.

EXAM:
DIGITAL SCREENING BILATERAL MAMMOGRAM WITH TOMOSYNTHESIS AND CAD
TECHNIQUE: Bilateral screening digital craniocaudal and mediolateral oblique
mammograms were obtained. Bilateral screening digital breast
tomosynthesis was performed. The images were evaluated with
computer-aided detection.

[L CC synth-2D]
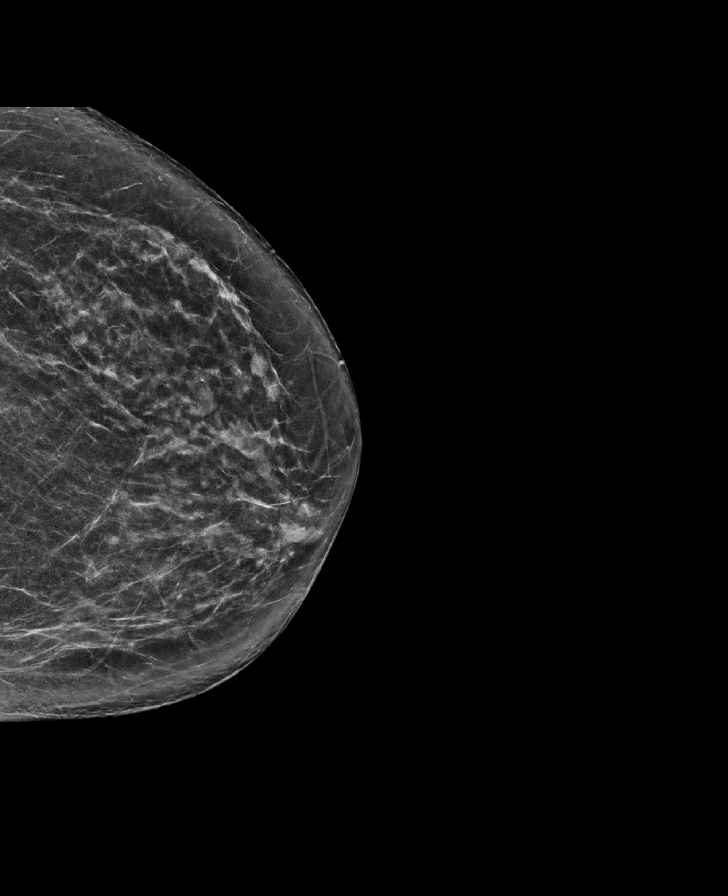

[L MLO synth-2D (1 of 2)]
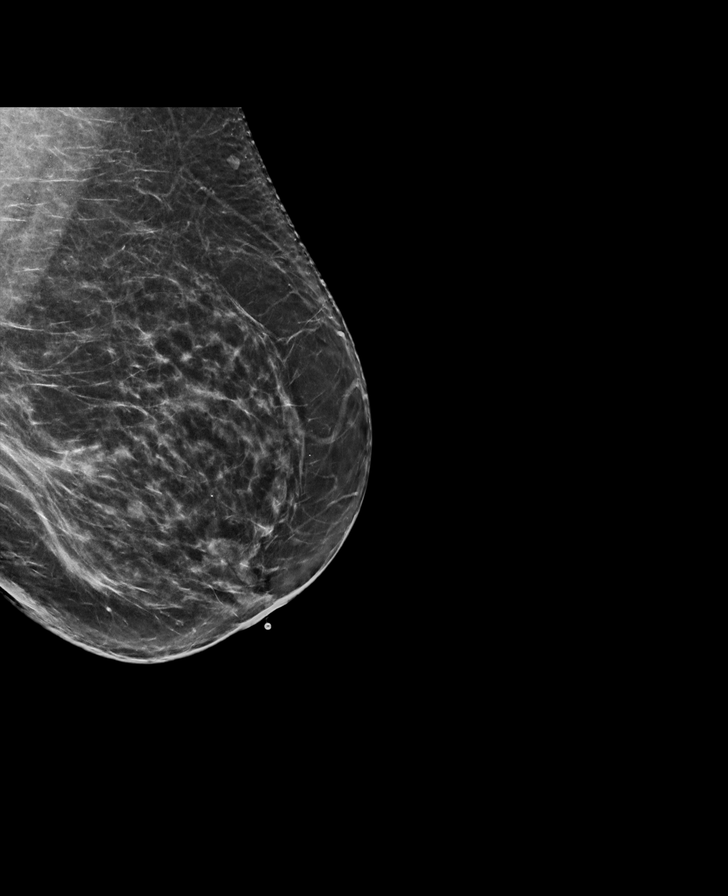

[L MLO synth-2D (2 of 2)]
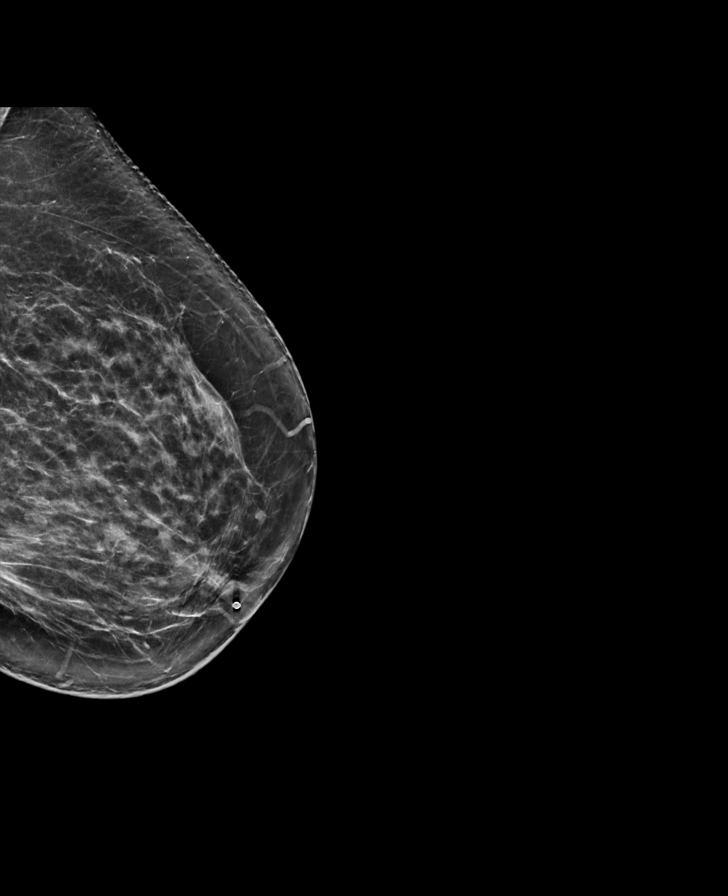

[R MLO synth-2D (1 of 2)]
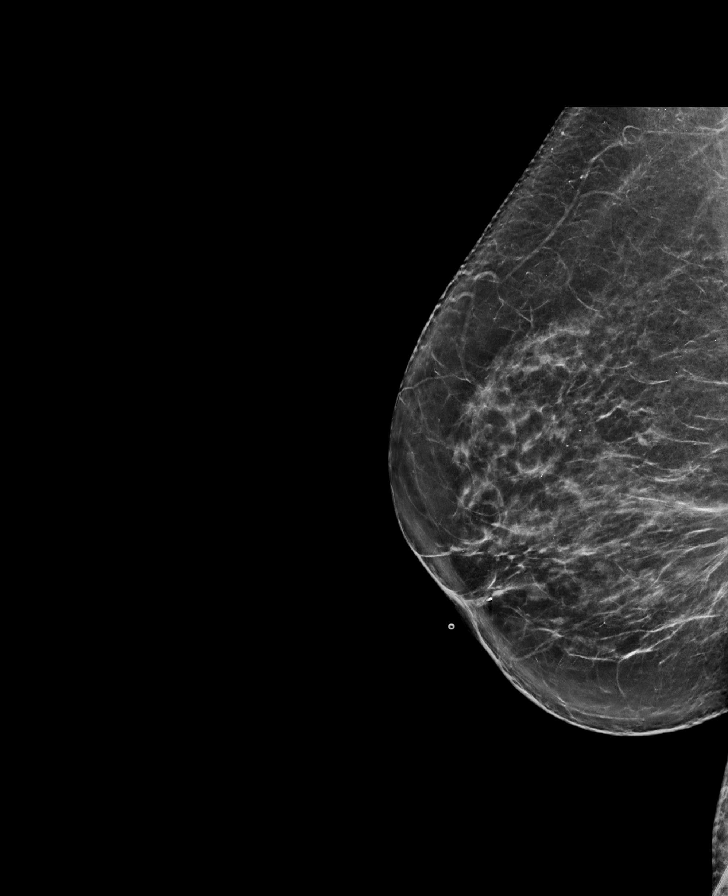

[R CV synth-2D]
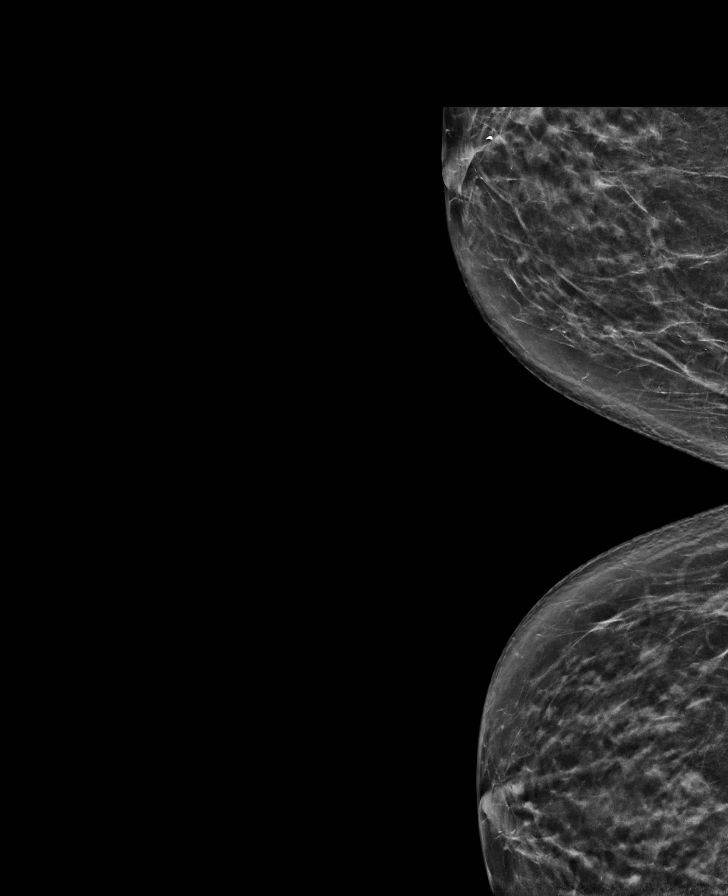

[R MLO synth-2D (2 of 2)]
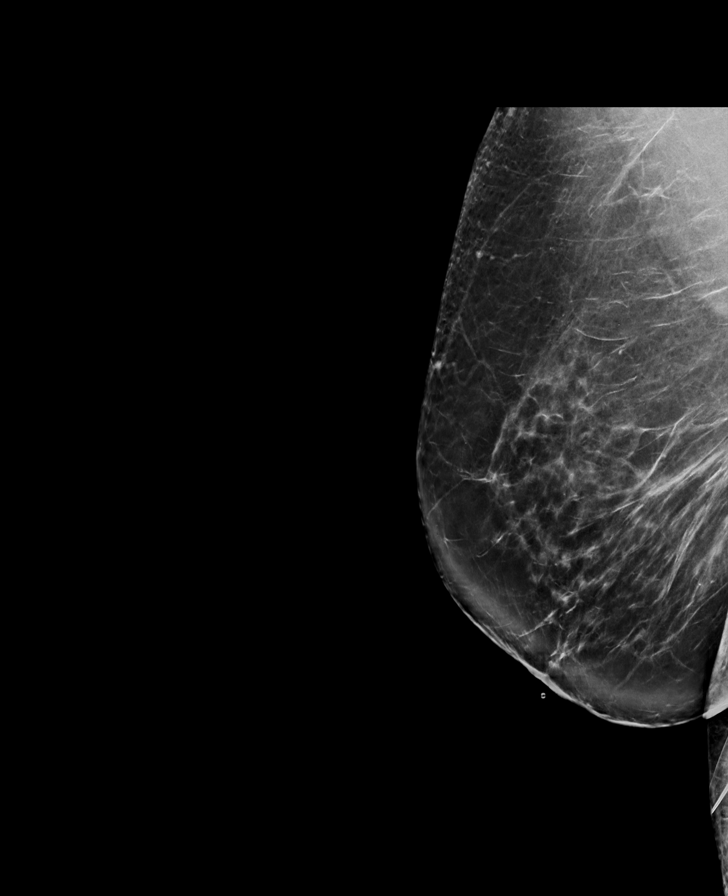

[R CC synth-2D]
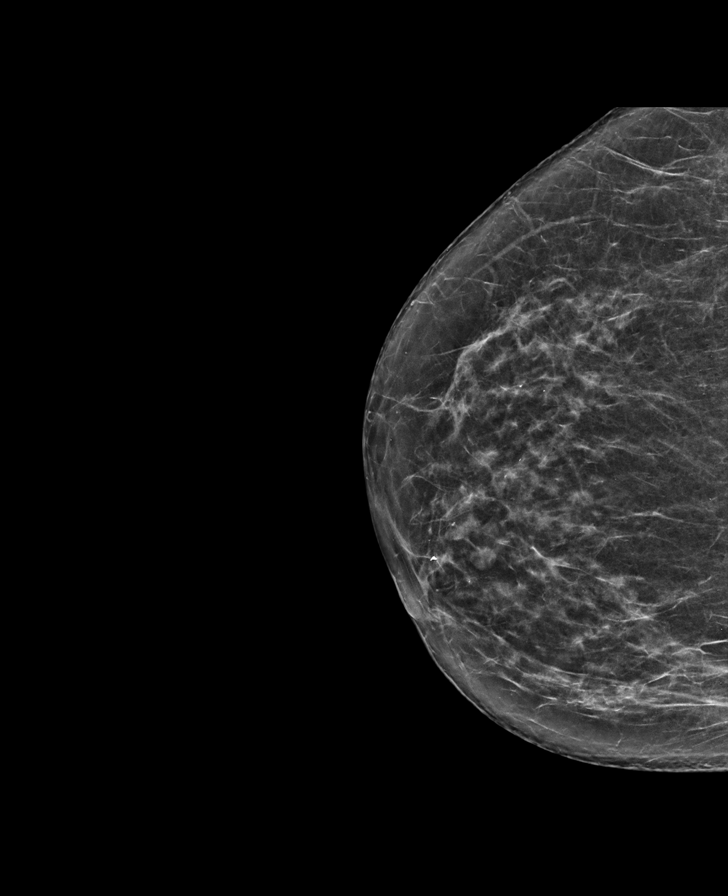

[L CC tomo · tomo slice 36/71.0]
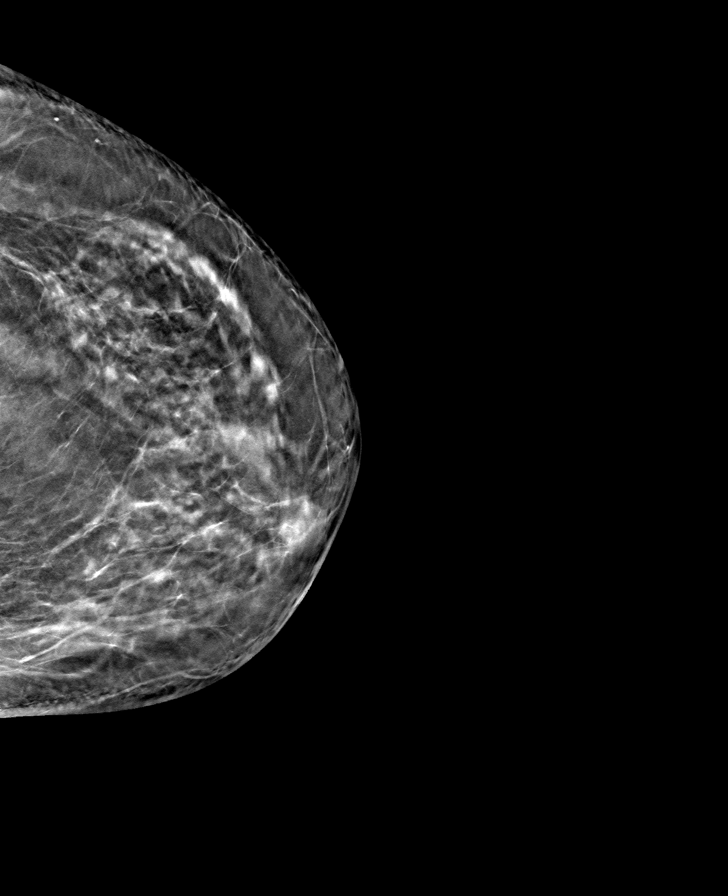

[8 of 40 positions shown; findings below may reference images not displayed]

ACR Breast Density Category b: There are scattered areas of
fibroglandular density.
FINDINGS: There are no findings suspicious for malignancy.
IMPRESSION: No mammographic evidence of malignancy. A result letter of this
screening mammogram will be mailed directly to the patient.

RECOMMENDATION:
Screening mammogram in one year. (Code:51-O-LD2)

BI-RADS CATEGORY  1: Negative.

## 2023-10-12 ENCOUNTER — Ambulatory Visit (INDEPENDENT_AMBULATORY_CARE_PROVIDER_SITE_OTHER): Payer: Medicaid Other | Admitting: Psychology

## 2023-10-12 DIAGNOSIS — F4381 Prolonged grief disorder: Secondary | ICD-10-CM | POA: Diagnosis not present

## 2023-10-12 DIAGNOSIS — F331 Major depressive disorder, recurrent, moderate: Secondary | ICD-10-CM

## 2023-10-12 DIAGNOSIS — F319 Bipolar disorder, unspecified: Secondary | ICD-10-CM

## 2023-10-12 NOTE — Progress Notes (Signed)
 KIANNAH GRUNOW is a 58 y.o. female patient     Diagnosis Bipolar,  complicated grief, depression Symptoms unspecified Medication Status compliance  Safety none  If Suicidal or Homicidal State Action Taken: unspecified  Current Risk: low Medications See chart Objectives unspecified Client Response full compliance  Service Location Location, [address] [place], [place] [address]  Service Code cpt [phone]  Facilitate problem solving  Normalize/Reframe  Relaxation training  Self-monitoring  Self care activities  Emotion regulation skills  Validate/empathize  Behavioral activation plan  Session notes:  Dx. Bipolar 1,  and Complicated Grief  Meds:Cymbalta (30mg  then increase to 90mg ), Clonazepam 1mg , Wellcall 625mg  (3, twice daily with food), dextroamphetamine 20mg  (1 in afternoon), Trulicity (1 injection/week), Zetia 10mg , Gabapentin 300mg  at bedtime, Metforman 500mg  twice daily, Toprol XL 50mg  in morning, Protonix 40mg  in evening, Actos 45 mg in morning, Rexulti 2 mg in morning, Trazedone 200mg , Vit. D2, Vivance 70mg  every morning, B12 and baby aspirin. Goals/Treatment Plan: Patient reports feeling depressed with symptoms of sadness, despair, hopelessness and helplessness. Struggles to manage daily affairs and live independently. Will utilize behavioral and insight oriented therapy to address symptoms. Needs support to organize herself and develop confidence to make decisions.  Goal Date is 12-25. She also needs help to grieve the loss of her husband. Date to achieve goal is 6-25.  Patient agrees to a video Public affairs consultant) session and understands the limits of this platform. She is at home and provider in his  home office.   Burdette Carolin says she has been playing with her grand kids. She says she still feels that it was a good move to live with daughter and her family.  Reports she has been fatigued and got recent blood work at her doctor's office. Thinks it may be her thyroid. Says she is getting sad as the anniversary of her husband Sharanya Templin's death is 06-Dec-2023. She is suspending efforts to find a job until Fall, so she can spent time with the (grand) kids.  Trying to relax by reading, but her attention span is short and she has a hard time getting through a book. Gave up reading for a while, but is trying to get back to it now.                               Kipling Graser LEWIS Time: 2:10p- 3:00p 50 min.

## 2023-10-21 ENCOUNTER — Ambulatory Visit: Admitting: Gastroenterology

## 2023-10-22 NOTE — Progress Notes (Unsigned)
 10/23/2023 Nancy Harris [id number] April 26, 1966  Referring provider: Lonie Roa, MD Primary GI doctor: Dr. Venice Gillis  ASSESSMENT AND PLAN:   Longstanding GERD associated with asthma LPR Follows allergy /asthma Dr. Jerelene Monday S/p cholecystectomy  Nonresponsive to PPI , on protonix  40 mg BID and pepcid , can still have nights with acid refluxing into her mouth, can cough/hoarseness despite inhalers 08/12/2023 EGD for refractory GERD showed small transient hiatal hernia medium amount of food in the stomach without outlet obstruction normal duodenum Patient was being considered for TIF versus Nissan fundoplication Has not had gastroparesis study after EGD  Has been diabetic x 2008, last A1C was 5.3 Can feel she has food sit in her stomach, can burp up food day before, no nausea, vomiting, no weight loss -Continue with antireflux measures and lifestyle modifications, continue on pantoprazole  40 mg BID emphasizing timing prior to meals, if this does not help can consider voqezna, elevated head of bed, consider referral to weight loss center Patient is interested in surgical options for GERD but will need to get GES and barium swallow to evaluate, has some evidence of gastroparesis as well as choking on liquids -Will consider esophageal manometry and 24-hour pH impedance study on PPI  - gastroparesis diet given  Chronic constipation/IBS 04/22/2019 colonoscopy nonbleeding internal hemorrhoids normal TI unremarkable recall 10 years  Asthma/allergies Follows with Dr. Kozlow Just started on prednisone for allergies  Type 2 diabetes with mild neuropathy X 2008, A1C well controlled  RCC status post right nephrectomy with   Morbid obesity  Body mass index is 34.37 kg/m.  -Patient has been advised to make an attempt to improve diet and exercise patterns to aid in weight loss. -Recommended diet heavy in fruits and veggies and low in animal meats, cheeses, and dairy products, appropriate  calorie intake  Patient Care Team: Lonie Roa, MD as PCP - General (Family Medicine) Linde Reveal, NP as Nurse Practitioner  HISTORY OF PRESENT ILLNESS: 58 y.o. female with a past medical history listed below presents for evaluation of refractory GERD/LPR with asthma.   Discussed the use of AI scribe software for clinical note transcription with the patient, who gave verbal consent to proceed.  History of Present Illness   Nancy Harris "Nancy Harris" is a 58 year old female with asthma and reflux who presents with persistent cough and reflux symptoms. She was referred by Dr. Koslo for evaluation of LPR.  She has been experiencing persistent cough and hoarseness, which she attributes to her asthma and allergies. Her allergies have worsened recently, leading to increased hoarseness. She is currently on prednisone to manage her allergy  symptoms.  She has a long-standing history of reflux, managed with pantoprazole  twice daily and Pepcid  at night. Despite this regimen, she occasionally experiences nocturnal acid reflux, which causes coughing. She does not typically feel the reflux during the day but notes that it contributes to her cough.  An endoscopy performed in February revealed a small hiatal hernia and a significant amount of food in her stomach, suggesting delayed gastric emptying. She reports that food seems to stay in her stomach for a long time, as she can taste it the next day when burping. No nausea or vomiting, but she feels like food sits in her stomach like a brick.  She has a history of diabetes, diagnosed between 2008 and 2013. Her blood sugar levels were initially high but have been well-controlled with recent A1c levels around 5.2 to 5.3. She does not frequently monitor her blood sugar  and has misplaced her glucose meter since moving in November.  She moved in with her daughter in November and has gained approximately 15 pounds since then, which she attributes to eating dinner  late and going to bed shortly after. She has started taking walks after dinner to help with digestion.     She  reports that she has never smoked. She has never used smokeless tobacco. She reports that she does not currently use alcohol. She reports that she does not use drugs.  RELEVANT GI HISTORY, IMAGING AND LABS: Results   LABS HbA1c: 5.3% (08/2023)  DIAGNOSTIC Colonoscopy: Hemorrhoids, otherwise normal (03/2019) Endoscopy: Small hiatal hernia, food in stomach, no esophageal abnormalities, no gastritis (08/2023)     EGD 08/2015 -Mild gastritis. Bx- neg for HP -Retained food -Otherwise normal EGD. -Had EGD 03/2011 with negative small bowel biopsies for celiac disease.   Colonoscopy 04/22/2019 -Nonbleeding internal hemorrhoids -Otherwise normal colonoscopy. -Colon was highly redundant. -Repeat in 10 years.  Earlier, if with any problems.  Current Medications:   Current Outpatient Medications (Endocrine & Metabolic):    levothyroxine (SYNTHROID) 125 MCG tablet, Take 125 mcg by mouth daily.   metFORMIN (GLUCOPHAGE-XR) 500 MG 24 hr tablet, Take 500 mg by mouth 2 (two) times daily.   pioglitazone (ACTOS) 45 MG tablet, Take 45 mg by mouth every morning.  Current Outpatient Medications (Cardiovascular):    colesevelam (WELCHOL) 625 MG tablet, Take 1,875 mg by mouth 2 (two) times daily with a meal.   ezetimibe (ZETIA) 10 MG tablet,    metoprolol succinate (TOPROL-XL) 50 MG 24 hr tablet, Take 50 mg by mouth daily after breakfast. Take with or immediately following a meal.  Current Outpatient Medications (Respiratory):    AIRSUPRA  90-80 MCG/ACT AERO, Inhale 2 puffs into the lungs as needed (every four to six hours for cough, wheeze, shortness of breath.  Rinse, gargle, and spit after use).   Budeson-Glycopyrrol-Formoterol (BREZTRI  AEROSPHERE) 160-9-4.8 MCG/ACT AERO, Inhale two puffs with spacer twice daily to prevent cough or wheeze.  Rinse, gargle, and spit after use.   fexofenadine   (ALLEGRA ) 180 MG tablet, Can take one tablet by mouth once daily if needed.   fluticasone  (FLOVENT  HFA) 44 MCG/ACT inhaler, Can use two puffs along with Albuterol  every six hours as needed for cough, wheeze, shortness of breath.  Rinse, gargle, and spit after use.  Current Outpatient Medications (Analgesics):    aspirin EC 81 MG tablet, Take 81 mg by mouth at bedtime.  Current Outpatient Medications (Hematological):    Cyanocobalamin (VITAMIN B-12 PO), Take 1,000 mcg by mouth every morning.   Ferrous Sulfate (IRON) 325 (65 Fe) MG TABS, Take 1 tablet by mouth at bedtime.  Current Outpatient Medications (Other):    brexpiprazole (REXULTI) 2 MG TABS tablet, Take 2 mg by mouth daily.   CALCIUM-VITAMIN D PO, Take by mouth daily.   clonazePAM (KLONOPIN) 1 MG tablet, Take 1 mg by mouth daily after breakfast.   DEXTROAMPHETAMINE SULFATE PO, Take 20 mg by mouth daily.   DULoxetine (CYMBALTA) 30 MG capsule, Take 90 mg by mouth daily.   famotidine  (PEPCID ) 40 MG tablet, TAKE 1 TABLET AT BEDTIME   pantoprazole  (PROTONIX ) 40 MG tablet, TAKE 1 TABLET TWICE A DAY AS DIRECTED   rOPINIRole (REQUIP) 2 MG tablet, Take 2 mg by mouth at bedtime.   traZODone (DESYREL) 100 MG tablet, Take 150 mg by mouth at bedtime as needed.   Vitamin D, Ergocalciferol, (DRISDOL) 1.25 MG (50000 UNIT) CAPS capsule, Take by mouth.  VYVANSE 50 MG capsule, Take 50 mg by mouth every morning.  Medical History:  Past Medical History:  Diagnosis Date   ADD (attention deficit disorder)    Allergy     Anemia    past hx    Arthritis    Brown recluse spider bite 02/2022   required IV antibiotics   Chronic insomnia 09/03/2020   Chronic kidney disease    Chronic tension-type headache, not intractable 08/15/2015   Diabetes (HCC)    Diabetes mellitus without complication    Essential tremor 11/30/2012   Excessive daytime sleepiness 09/03/2020   FH: kidney cancer    Generalized anxiety disorder    GERD (gastroesophageal reflux  disease)    High cholesterol    History of skin cancer    Hyperlipidemia    Hypertension    Insomnia    Major depressive disorder    Neuromuscular disorder    HH, CTS, neuropathy feet and hands    Obstructive sleep apnea 11/06/2020   Osteoporosis    Paget's disease of bone 11/30/2017   Postmenopausal osteoporosis 11/30/2017   Renal cell carcinoma (HCC)    Restless leg syndrome    Thyroid disease    Allergies:  Allergies  Allergen Reactions   Latex Itching     Surgical History:  She  has a past surgical history that includes Cesarean section (10/1990); Cholecystectomy (10/2000); Total abdominal hysterectomy (10/2004); Nasal septum surgery; Upper gastrointestinal endoscopy; Colonoscopy; nephrectomy (Right, 05/30/2022); and Esophagogastroduodenoscopy (09/26/2015). Family History:  Her family history includes Allergic rhinitis in her sister; Alzheimer's disease in her maternal grandfather and mother; Asthma in her sister; Colon cancer in her paternal grandmother; Diabetes in her father, sister, and sister; Lung cancer in her father and mother.  REVIEW OF SYSTEMS  : All other systems reviewed and negative except where noted in the History of Present Illness.  PHYSICAL EXAM: BP 112/74   Pulse 63   Wt 176 lb (79.8 kg)   BMI 34.37 kg/m  General Appearance: Well nourished, in no apparent distress. Respiratory: Respiratory effort normal, BS equal bilaterally without rales, rhonchi, wheezing. Cardio: RRR with no MRGs. Abdomen: Soft,  Obese ,active bowel sounds. mild tenderness in the epigastrium. Without guarding and Without rebound. No masses. Rectal: Not evaluated Musculoskeletal: Full ROM, Normal gait Neuro: Alert and  oriented x4;  No focal deficits. Psych:  Cooperative. Normal mood and affect.    Edmonia Gottron, PA-C 11:11 AM

## 2023-10-23 ENCOUNTER — Encounter: Payer: Self-pay | Admitting: Physician Assistant

## 2023-10-23 ENCOUNTER — Ambulatory Visit: Admitting: Physician Assistant

## 2023-10-23 VITALS — BP 112/74 | HR 63 | Wt 176.0 lb

## 2023-10-23 DIAGNOSIS — Z6834 Body mass index (BMI) 34.0-34.9, adult: Secondary | ICD-10-CM

## 2023-10-23 DIAGNOSIS — R1314 Dysphagia, pharyngoesophageal phase: Secondary | ICD-10-CM | POA: Diagnosis not present

## 2023-10-23 DIAGNOSIS — K219 Gastro-esophageal reflux disease without esophagitis: Secondary | ICD-10-CM

## 2023-10-23 DIAGNOSIS — E1140 Type 2 diabetes mellitus with diabetic neuropathy, unspecified: Secondary | ICD-10-CM | POA: Diagnosis not present

## 2023-10-23 DIAGNOSIS — Z9049 Acquired absence of other specified parts of digestive tract: Secondary | ICD-10-CM

## 2023-10-23 DIAGNOSIS — J45909 Unspecified asthma, uncomplicated: Secondary | ICD-10-CM

## 2023-10-23 DIAGNOSIS — K5909 Other constipation: Secondary | ICD-10-CM

## 2023-10-23 DIAGNOSIS — Z7984 Long term (current) use of oral hypoglycemic drugs: Secondary | ICD-10-CM

## 2023-10-23 DIAGNOSIS — E119 Type 2 diabetes mellitus without complications: Secondary | ICD-10-CM

## 2023-10-23 NOTE — Patient Instructions (Signed)
 You have been scheduled for a Barium Esophogram at [facility] (1st floor of the hospital) on 11/10/23 at 10 am. Please arrive 7:30 am to your appointment for registration. Make certain not to have anything to eat or drink 6 hours prior to your test. If you need to reschedule for any reason, please contact radiology at [phone] to do so. __________________________________________________________________ A barium swallow is an examination that concentrates on views of the esophagus. This tends to be a double contrast exam (barium and two liquids which, when combined, create a gas to distend the wall of the oesophagus) or single contrast (non-ionic iodine based). The study is usually tailored to your symptoms so a good history is essential. Attention is paid during the study to the form, structure and configuration of the esophagus, looking for functional disorders (such as aspiration, dysphagia, achalasia, motility and reflux) EXAMINATION You may be asked to change into a gown, depending on the type of swallow being performed. A radiologist and radiographer will perform the procedure. The radiologist will advise you of the type of contrast selected for your procedure and direct you during the exam. You will be asked to stand, sit or lie in several different positions and to hold a small amount of fluid in your mouth before being asked to swallow while the imaging is performed .In some instances you may be asked to swallow barium coated marshmallows to assess the motility of a solid food bolus. The exam can be recorded as a digital or video fluoroscopy procedure. POST PROCEDURE It will take 1-2 days for the barium to pass through your system. To facilitate this, it is important, unless otherwise directed, to increase your fluids for the next 24-48hrs and to resume your normal diet.  This test typically takes about 30 minutes to  perform. __________________________________________________________________________________   Elene Griffes have been scheduled for a gastric emptying scan at [facility] on 11/06/23 at 8 am. Please arrive at least 30 minutes prior to your appointment for registration. Please make certain not to have anything to eat or drink after midnight the night before your test. Hold all stomach medications (ex: Zofran, phenergan, Reglan) 24 hours prior to your test. If you need to reschedule your appointment, please contact radiology scheduling at [phone]. _____________________________________________________________________ A gastric-emptying study measures how long it takes for food to move through your stomach. There are several ways to measure stomach emptying. In the most common test, you eat food that contains a small amount of radioactive material. A scanner that detects the movement of the radioactive material is placed over your abdomen to monitor the rate at which food leaves your stomach. This test normally takes about 4 hours to complete. _____________________________________________________________________    Gastroparesis Please do small frequent meals like 4-6 meals a day.  Eat and drink liquids at separate times.  Avoid high fiber foods, cook your vegetables, avoid high fat food.  Suggest spreading protein throughout the day (greek yogurt, glucerna, soft meat, milk, eggs) Choose soft foods that you can mash with a fork When you are more symptomatic, change to pureed foods foods and liquids.  Consider reading "Living well with Gastroparesis" by Creasie Doctor Gastroparesis is a condition in which food takes longer than normal to empty from the stomach. This condition is also known as delayed gastric emptying. It is usually a long-term (chronic) condition. There is no cure, but there are treatments and things that you can do at home to help relieve symptoms. Treating the underlying  condition that causes gastroparesis can also help relieve symptoms What are the causes? In many cases, the cause of this condition is not known. Possible causes include: A hormone (endocrine) disorder, such as hypothyroidism or diabetes. A nervous system disease, such as Parkinson's disease or multiple sclerosis. Cancer, infection, or surgery that affects the stomach or vagus nerve. The vagus nerve runs from your chest, through your neck, and to the lower part of your brain. A connective tissue disorder, such as scleroderma. Certain medicines. What increases the risk? You are more likely to develop this condition if: You have certain disorders or diseases. These may include: An endocrine disorder. An eating disorder. Amyloidosis. Scleroderma. Parkinson's disease. Multiple sclerosis. Cancer or infection of the stomach or the vagus nerve. You have had surgery on your stomach or vagus nerve. You take certain medicines. You are female. What are the signs or symptoms? Symptoms of this condition include: Feeling full after eating very little or a loss of appetite. Nausea, vomiting, or heartburn. Bloating of your abdomen. Inconsistent blood sugar (glucose) levels on blood tests. Unexplained weight loss. Acid from the stomach coming up into the esophagus (gastroesophageal reflux). Sudden tightening (spasm) of the stomach, which can be painful. Symptoms may come and go. Some people may not notice any symptoms. How is this diagnosed? This condition is diagnosed with tests, such as: Tests that check how long it takes food to move through the stomach and intestines. These tests include: Upper gastrointestinal (GI) series. For this test, you drink a liquid that shows up well on X-rays, and then X-rays are taken of your intestines. Gastric emptying scintigraphy. For this test, you eat food that contains a small amount of radioactive material, and then scans are taken. Wireless capsule GI  monitoring system. For this test, you swallow a pill (capsule) that records information about how foods and fluid move through your stomach. Gastric manometry. For this test, a tube is passed down your throat and into your stomach to measure electrical and muscular activity. Endoscopy. For this test, a long, thin tube with a camera and light on the end is passed down your throat and into your stomach to check for problems in your stomach lining. Ultrasound. This test uses sound waves to create images of the inside of your body. This can help rule out gallbladder disease or pancreatitis as a cause of your symptoms. How is this treated? There is no cure for this condition, but treatment and home care may relieve symptoms. Treatment may include: Treating the underlying cause. Managing your symptoms by making changes to your diet and exercise habits. Taking medicines to control nausea and vomiting and to stimulate stomach muscles. Getting food through a feeding tube in the hospital. This may be done in severe cases. Having surgery to insert a device called a gastric electrical stimulator into your body. This device helps improve stomach emptying and control nausea and vomiting. Follow these instructions at home: Take over-the-counter and prescription medicines only as told by your health care provider. Follow instructions from your health care provider about eating or drinking restrictions. Your health care provider may recommend that you: Eat smaller meals more often. Eat low-fat foods. Eat low-fiber forms of high-fiber foods. For example, eat cooked vegetables instead of raw vegetables. Have only liquid foods instead of solid foods. Liquid foods are easier to digest. Drink enough fluid to keep your urine pale yellow. Exercise as often as told by your health care provider. Keep all follow-up visits. This is important.  Contact a health care provider if you: Notice that your symptoms do not improve  with treatment. Have new symptoms. Get help right away if you: Have severe pain in your abdomen that does not improve with treatment. Have nausea that is severe or does not go away. Vomit every time you drink fluids. Summary Gastroparesis is a long-term (chronic) condition in which food takes longer than normal to empty from the stomach. Symptoms include nausea, vomiting, heartburn, bloating of your abdomen, and loss of appetite. Eating smaller portions, low-fat foods, and low-fiber forms of high-fiber foods may help you manage your symptoms. Get help right away if you have severe pain in your abdomen. This information is not intended to replace advice given to you by your health care provider. Make sure you discuss any questions you have with your health care provider. Document Revised: 10/24/2019 Document Reviewed: 10/24/2019 Elsevier Patient Education  2021 Elsevier Inc.  Please take your proton pump inhibitor medication, pantoprazole  40 mg twice a day  Please take this medication 30 minutes to 1 hour before meals- this makes it more effective.  Avoid spicy and acidic foods Avoid fatty foods Limit your intake of coffee, tea, alcohol, and carbonated drinks Work to maintain a healthy weight Keep the head of the bed elevated at least 3 inches with blocks or a wedge pillow if you are having any nighttime symptoms Stay upright for 2 hours after eating Avoid meals and snacks three to four hours before bedtime

## 2023-11-03 ENCOUNTER — Other Ambulatory Visit (HOSPITAL_BASED_OUTPATIENT_CLINIC_OR_DEPARTMENT_OTHER): Payer: Self-pay | Admitting: Family Medicine

## 2023-11-03 ENCOUNTER — Ambulatory Visit (HOSPITAL_BASED_OUTPATIENT_CLINIC_OR_DEPARTMENT_OTHER)
Admission: RE | Admit: 2023-11-03 | Discharge: 2023-11-03 | Disposition: A | Discharge status: Home / Self Care | Source: Ambulatory Visit | Attending: Family Medicine | Admitting: Family Medicine

## 2023-11-03 DIAGNOSIS — R062 Wheezing: Secondary | ICD-10-CM | POA: Diagnosis not present

## 2023-11-03 DIAGNOSIS — R059 Cough, unspecified: Secondary | ICD-10-CM | POA: Diagnosis not present

## 2023-11-06 ENCOUNTER — Ambulatory Visit (HOSPITAL_COMMUNITY)

## 2023-11-10 ENCOUNTER — Other Ambulatory Visit (HOSPITAL_COMMUNITY)

## 2023-11-11 ENCOUNTER — Other Ambulatory Visit: Payer: Self-pay | Admitting: Allergy and Immunology

## 2023-11-16 ENCOUNTER — Ambulatory Visit: Payer: Medicaid Other | Admitting: Psychology

## 2023-11-16 DIAGNOSIS — F319 Bipolar disorder, unspecified: Secondary | ICD-10-CM | POA: Diagnosis not present

## 2023-11-16 DIAGNOSIS — F4381 Prolonged grief disorder: Secondary | ICD-10-CM | POA: Diagnosis not present

## 2023-11-16 DIAGNOSIS — F331 Major depressive disorder, recurrent, moderate: Secondary | ICD-10-CM

## 2023-11-16 NOTE — Progress Notes (Signed)
                                                                                                                                                        Nancy Harris is a 58 y.o. female patient     Diagnosis Bipolar,  complicated grief, depression Symptoms unspecified Medication Status compliance  Safety none  If Suicidal or Homicidal State Action Taken: unspecified  Current Risk: low Medications See chart Objectives unspecified Client Response full compliance  Service Location Location, [address] [place], [place] [address]  Service Code cpt [phone]  Facilitate problem solving  Normalize/Reframe  Relaxation training  Self-monitoring  Self care activities  Emotion regulation skills  Validate/empathize  Behavioral activation plan  Session notes:  Dx. Bipolar 1,  and Complicated Grief  Meds:Cymbalta (30mg  then increase to 90mg ), Clonazepam 1mg , Wellcall 625mg  (3, twice daily with food), dextroamphetamine 20mg  (1 in afternoon), Trulicity (1 injection/week), Zetia 10mg , Gabapentin 300mg  at bedtime, Metforman 500mg  twice daily, Toprol XL 50mg  in morning, Protonix  40mg  in evening, Actos 45 mg in morning, Rexulti 2 mg in morning, Trazedone 200mg , Vit. D2, Vivance 70mg  every morning, B12 and baby aspirin. Goals/Treatment Plan: Patient reports feeling depressed with symptoms of sadness, despair, hopelessness and helplessness. Struggles to manage daily affairs and live independently. Will utilize behavioral and insight oriented therapy to address symptoms. Needs support to organize herself and develop confidence to make decisions.  Goal Date is 12-25. She also needs help to grieve the loss of her husband. Date to achieve goal is 6-25.  Patient agrees to a video Public affairs consultant) session and understands the limits of this platform. She  is at home and provider in his home office.   Nancy Harris was excited to see her granddaughter graduate college in [place] . She stayed in [place]  for a week. Except for this event, she says she is sad and lonely. She has tried starting a regular walking rountine and that has been helpful. We talked about some options to get out and interact with other people. Looking at [employer]. Will explore further.                                      Nancy Harris Time: 2:10p- 3:00p 50 min.

## 2023-11-19 ENCOUNTER — Ambulatory Visit: Payer: Self-pay | Admitting: Physician Assistant

## 2023-11-19 ENCOUNTER — Ambulatory Visit (HOSPITAL_COMMUNITY)
Admission: RE | Admit: 2023-11-19 | Discharge: 2023-11-19 | Disposition: A | Discharge status: Home / Self Care | Source: Ambulatory Visit | Attending: Physician Assistant | Admitting: Physician Assistant

## 2023-11-19 DIAGNOSIS — E119 Type 2 diabetes mellitus without complications: Secondary | ICD-10-CM | POA: Diagnosis present

## 2023-11-19 DIAGNOSIS — K219 Gastro-esophageal reflux disease without esophagitis: Secondary | ICD-10-CM | POA: Insufficient documentation

## 2023-11-19 DIAGNOSIS — R1314 Dysphagia, pharyngoesophageal phase: Secondary | ICD-10-CM | POA: Diagnosis present

## 2023-11-19 MED ORDER — TECHNETIUM TC 99M SULFUR COLLOID
2.0000 | Freq: Once | INTRAVENOUS | Status: AC
  Administered 2023-11-19: 2 via ORAL

## 2023-11-24 ENCOUNTER — Ambulatory Visit (HOSPITAL_COMMUNITY)
Admission: RE | Admit: 2023-11-24 | Discharge: 2023-11-24 | Disposition: A | Discharge status: Home / Self Care | Source: Ambulatory Visit | Attending: Physician Assistant | Admitting: Physician Assistant

## 2023-11-24 DIAGNOSIS — R1314 Dysphagia, pharyngoesophageal phase: Secondary | ICD-10-CM | POA: Insufficient documentation

## 2023-11-24 DIAGNOSIS — K219 Gastro-esophageal reflux disease without esophagitis: Secondary | ICD-10-CM | POA: Insufficient documentation

## 2023-12-31 ENCOUNTER — Other Ambulatory Visit: Payer: Self-pay | Admitting: Family Medicine

## 2023-12-31 DIAGNOSIS — Z1231 Encounter for screening mammogram for malignant neoplasm of breast: Secondary | ICD-10-CM

## 2024-01-06 ENCOUNTER — Ambulatory Visit: Admitting: Gastroenterology

## 2024-01-13 ENCOUNTER — Ambulatory Visit
Admission: RE | Admit: 2024-01-13 | Discharge: 2024-01-13 | Disposition: A | Discharge status: Home / Self Care | Source: Ambulatory Visit | Attending: Family Medicine | Admitting: Family Medicine

## 2024-01-13 DIAGNOSIS — Z1231 Encounter for screening mammogram for malignant neoplasm of breast: Secondary | ICD-10-CM

## 2024-01-22 ENCOUNTER — Other Ambulatory Visit: Payer: Self-pay | Admitting: Allergy and Immunology

## 2024-02-09 ENCOUNTER — Ambulatory Visit: Admitting: Psychology

## 2024-02-19 ENCOUNTER — Other Ambulatory Visit: Payer: Self-pay | Admitting: Allergy & Immunology

## 2024-03-09 ENCOUNTER — Encounter: Payer: Self-pay | Admitting: Gastroenterology

## 2024-03-09 ENCOUNTER — Ambulatory Visit: Admitting: Gastroenterology

## 2024-03-09 VITALS — BP 116/76 | HR 90 | Ht 60.0 in | Wt 178.0 lb

## 2024-03-09 DIAGNOSIS — K3184 Gastroparesis: Secondary | ICD-10-CM

## 2024-03-09 DIAGNOSIS — E1143 Type 2 diabetes mellitus with diabetic autonomic (poly)neuropathy: Secondary | ICD-10-CM

## 2024-03-09 DIAGNOSIS — K219 Gastro-esophageal reflux disease without esophagitis: Secondary | ICD-10-CM

## 2024-03-09 DIAGNOSIS — K5909 Other constipation: Secondary | ICD-10-CM

## 2024-03-09 DIAGNOSIS — Z7984 Long term (current) use of oral hypoglycemic drugs: Secondary | ICD-10-CM

## 2024-03-09 MED ORDER — METOCLOPRAMIDE HCL 10 MG PO TABS: 10.0000 mg | ORAL_TABLET | Freq: Two times a day (BID) | ORAL | 1 refills | Status: AC

## 2024-03-09 NOTE — Progress Notes (Signed)
 Chief Complaint: For FU  Referring Provider:  Trinidad Hun, MD      ASSESSMENT AND PLAN;   #1. Longstanding GERD despite PPIs. Assoc asthma, LPR. Not a candidate for TIF/Nissens.  #2. Diabetic gastroparesis on GES 10/2023- excerberated by wegovy  #3. Chronic constipation. Neg colon 2020.  Plan: -Continue protonix  40mg  po BID -Continue pepcid  40mg  po at bedtime -Gastroparesis diabetic diet -Trial of Reglan  10mg  BID - 1/2 hr before meals and bedtime.  Watch for any extraparametal side effects.  Explained to the patient. -Best possible control of DM -Nonpharmacologic means of reflux control. -Please resume MiraLAX 17 g p.o. daily -If still with problems, CT AP followed by then trial of motrigrity 2mg . -RTC 6 months.   HPI:    Nancy Harris is a 58 y.o. female  With multiple medical problems including DM2 (with mild neuropathy) with gastroparesis, IBS, anxiety/depression, HTN, HLD, CKD, history of RCC s/p right nephrectomy, OSA with chronic insomnia History of Present Illness  Her reflux symptoms have improved and are currently managed with Protonix  twice daily and Pepcid  40 mg at night. She does not experience dysphagia but notes prolonged gastric retention of food, consistent with previous test results. Regurgitation of food from the night before is less frequent.  Her diabetes is well-controlled with a recent hemoglobin A1c of 5.3. She recently started [facility] for weight loss and has not experienced nausea. Her weight has increased from 163 pounds in February to 178 pounds currently.  She experiences constipation with bowel movements about twice a week. She has not been taking Miralax regularly. She has not tried Reglan  before, which was discussed as a potential treatment to improve gastric motility.  She lives with her daughter in her basement, which limits her mobility. She attempts to walk once or twice a week in her neighborhood but acknowledges the need to increase  her activity level. She feels bloated after consuming coffee and yogurt.  She had a thyroid blood test recently but is awaiting results. Her vision has remained stable over the past few years, and she wears contacts most of the time. No stomach pain and her last colonoscopy in 2020 was normal.   Wt Readings from Last 3 Encounters:  03/09/24 178 lb (80.7 kg)  10/23/23 176 lb (79.8 kg)  08/12/23 163 lb (73.9 kg)   Past GI history: EGD 08/2023 - Small transient hiatal hernia. - A medium amount of food ( residue) in the stomach without outlet obstruction - Normal examined duodenum. - Biopsies were taken with a cold forceps for evaluation of eosinophilic esophagitis. - Bx:   GES 10/2023 1% emptied at 1 hr ( normal >= 10%) 8% emptied at 2 hr ( normal >= 40%) 15% emptied at 3 hr ( normal >= 70%) 20% emptied at 4 hr ( normal >= 90%) IMPRESSION: Delayed gastric emptying  Ba Swallow: 10/2023 Moderate gastroesophageal reflux. Otherwise unremarkable exam. No evidence of hiatal hernia or esophageal stricture.  EGD 08/2015 -Mild gastritis. Bx- neg for HP -Retained food -Otherwise normal EGD. -Had EGD 03/2011 with negative small bowel biopsies for celiac disease.  Colonoscopy 04/22/2019 -Nonbleeding internal hemorrhoids -Otherwise normal colonoscopy. -Colon was highly redundant. -Repeat in 10 years.  Earlier, if with any problems. Past Medical History:  Diagnosis Date   ADD (attention deficit disorder)    Allergy     Anemia    past hx    Arthritis    Brown recluse spider bite 02/2022   required IV antibiotics   Chronic  insomnia 09/03/2020   Chronic kidney disease    Chronic tension-type headache, not intractable 08/15/2015   Diabetes (HCC)    Diabetes mellitus without complication    Essential tremor 11/30/2012   Excessive daytime sleepiness 09/03/2020   FH: kidney cancer    Generalized anxiety disorder    GERD (gastroesophageal reflux disease)    High cholesterol    History of  skin cancer    Hyperlipidemia    Hypertension    Insomnia    Major depressive disorder    Neuromuscular disorder    HH, CTS, neuropathy feet and hands    Obstructive sleep apnea 11/06/2020   Osteoporosis    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 Otherwiser normal EGD   NASAL SEPTUM SURGERY     07/2008 or 07/2010- pt unsure    nephrectomy Right 05/30/2022   TOTAL ABDOMINAL HYSTERECTOMY  10/2004   UPPER GASTROINTESTINAL ENDOSCOPY      Family History  Problem Relation Age of Onset   Lung cancer Mother        Smoker   Alzheimer's disease Mother        early-onset; approximately 66   Diabetes Father    Lung cancer Father        Smoker   Diabetes Sister    Allergic rhinitis Sister    Diabetes Sister    Asthma Sister    Alzheimer's disease Maternal Grandfather    Colon cancer Paternal Grandmother    Esophageal varices Neg Hx    Liver disease Neg Hx     Social History   Tobacco Use   Smoking status: Never   Smokeless tobacco: Never  Vaping Use   Vaping status: Never Used  Substance Use Topics   Alcohol use: Not Currently   Drug use: Never    Current Outpatient Medications  Medication Sig Dispense Refill   AIRSUPRA  90-80 MCG/ACT AERO Inhale 2 puffs into the lungs as needed (every four to six hours for cough, wheeze, shortness of breath.  Rinse, gargle, and spit after use). 10.7 g 1   aspirin EC 81 MG tablet Take 81 mg by mouth at bedtime.     brexpiprazole (REXULTI) 2 MG TABS tablet Take 2 mg by mouth daily.     budesonide-glycopyrrolate-formoterol (BREZTRI  AEROSPHERE) 160-9-4.8 MCG/ACT AERO inhaler USE 2 INHALATIONS WITH SPACER TWICE A DAY TO PREVENT COUGH OR  WHEEZE. RINSE, GARGLE, AND SPIT AFTER USE 32.1 g 3   CALCIUM-VITAMIN D PO Take by mouth daily.     clonazePAM (KLONOPIN) 1 MG tablet Take 1 mg by mouth daily after breakfast.     colesevelam (WELCHOL) 625 MG tablet Take 1,875 mg by mouth 2 (two) times daily with a meal.     Cyanocobalamin (VITAMIN B-12 PO) Take 1,000 mcg by mouth every morning.     DEXTROAMPHETAMINE SULFATE PO Take 20 mg by mouth daily.     DULoxetine (CYMBALTA) 30 MG capsule Take 90 mg by mouth daily.     estradiol (ESTRACE) 0.1 MG/GM vaginal cream Place 1 g vaginally 2 (two) times a week.     estradiol (VIVELLE-DOT) 0.0375 MG/24HR Place 1 patch onto the skin 2 (  two) times a week.     ezetimibe (ZETIA) 10 MG tablet      famotidine  (PEPCID ) 40 MG tablet TAKE 1 TABLET AT BEDTIME 90 tablet 3   Ferrous Sulfate (IRON) 325 (65 Fe) MG TABS Take 1 tablet by mouth at bedtime.     fexofenadine  (ALLEGRA ) 180 MG tablet TAKE 1 TABLET DAILY IF NEEDED  (THIS PRODUCT IS GENERIC FOR ALLEGRA ) 90 tablet 3   fluticasone  (FLOVENT  HFA) 44 MCG/ACT inhaler Can use two puffs along with Albuterol  every six hours as needed for cough, wheeze, shortness of breath.  Rinse, gargle, and spit after use. 10.6 g 1   metFORMIN (GLUCOPHAGE-XR) 500 MG 24 hr tablet Take 500 mg by mouth 2 (two) times daily.     metoprolol succinate (TOPROL-XL) 50 MG 24 hr tablet Take 50 mg by mouth daily after breakfast. Take with or immediately following a meal.     pantoprazole  (PROTONIX ) 40 MG tablet TAKE 1 TABLET TWICE A DAY AS DIRECTED 180 tablet 1   pioglitazone (ACTOS) 45 MG tablet Take 45 mg by mouth every morning.     progesterone (PROMETRIUM) 100 MG capsule Take 100-200 mg by mouth at bedtime.     rOPINIRole (REQUIP) 2 MG tablet Take 2 mg by mouth at bedtime.     Vitamin D, Ergocalciferol, (DRISDOL) 1.25 MG (50000 UNIT) CAPS capsule Take by mouth.     VYVANSE 50 MG capsule Take 50 mg by mouth every morning.     WEGOVY 0.25 MG/0.5ML SOAJ SQ injection Inject 0.25 mg into the  skin once a week.     levothyroxine (SYNTHROID) 125 MCG tablet Take 125 mcg by mouth daily.     traZODone (DESYREL) 100 MG tablet Take 150 mg by mouth at bedtime as needed.     No current facility-administered medications for this visit.    Allergies  Allergen Reactions   Latex Itching    Review of Systems:  neg     Physical Exam:    BP 116/76 (BP Location: Left Arm, Patient Position: Sitting, Cuff Size: Normal)   Pulse 90   Ht 5' (1.524 m)   Wt 178 lb (80.7 kg)   BMI 34.76 kg/m  Wt Readings from Last 3 Encounters:  03/09/24 178 lb (80.7 kg)  10/23/23 176 lb (79.8 kg)  08/12/23 163 lb (73.9 kg)   Constitutional:  Well-developed, in no acute distress. Psychiatric: Normal mood and affect. Behavior is normal. HEENT: Pupils normal.  Conjunctivae are normal. No scleral icterus. Cardiovascular: Normal rate, regular rhythm. No edema Pulmonary/chest: Effort normal and breath sounds normal. No wheezing, rales or rhonchi. Abdominal: Soft, nondistended. Nontender. Bowel sounds active throughout. There are no masses palpable. No hepatomegaly. Rectal: Deferred Neurological: Alert and oriented to person place and time. Skin: Skin is warm and dry. No rashes noted.   Anselm Bring, MD 03/09/2024, 8:52 AM  Cc: Trinidad Hun, MD

## 2024-03-09 NOTE — Patient Instructions (Addendum)
 _______________________________________________________  If your blood pressure at your visit was 140/90 or greater, please contact your primary care physician to follow up on this.  _______________________________________________________  If you are age 58 or older, your body mass index should be between 23-30. Your Body mass index is 34.76 kg/m. If this is out of the aforementioned range listed, please consider follow up with your Primary Care Provider.  If you are age 70 or younger, your body mass index should be between 19-25. Your Body mass index is 34.76 kg/m. If this is out of the aformentioned range listed, please consider follow up with your Primary Care Provider.   ________________________________________________________  The [facility] providers would like to encourage you to use MYCHART to communicate with providers for non-urgent requests or questions.  Due to long hold times on the telephone, sending your provider a message by [facility] may be a faster and more efficient way to get a response.  Please allow 48 business hours for a response.  Please remember that this is for non-urgent requests.  _______________________________________________________  [facility] is using a team-based approach to care.  Your team is made up of your doctor and two to three APPS. Our APPS (Nurse Practitioners and Physician Assistants) work with your physician to ensure care continuity for you. They are fully qualified to address your health concerns and develop a treatment plan. They communicate directly with your gastroenterologist to care for you. Seeing the Advanced Practice Practitioners on your physician's team can help you by facilitating care more promptly, often allowing for earlier appointments, access to diagnostic testing, procedures, and other specialty referrals.   Please purchase the following medications over the counter and take as directed: Miralax 17g daily  We have sent  the following medications to your pharmacy for you to pick up at your convenience: Reglan  10mg  2 times a day before meal and at bedtime  Please follow up in 6 months. Give us  a call at [phone] to schedule an appointment.  Thank you,  Dr. Lynnie Bring

## 2024-03-21 ENCOUNTER — Ambulatory Visit (INDEPENDENT_AMBULATORY_CARE_PROVIDER_SITE_OTHER): Admitting: Psychology

## 2024-03-21 DIAGNOSIS — F331 Major depressive disorder, recurrent, moderate: Secondary | ICD-10-CM

## 2024-03-21 DIAGNOSIS — F319 Bipolar disorder, unspecified: Secondary | ICD-10-CM

## 2024-03-21 DIAGNOSIS — F4381 Prolonged grief disorder: Secondary | ICD-10-CM | POA: Diagnosis not present

## 2024-03-21 NOTE — Progress Notes (Signed)
 Nancy Harris is a 58 y.o. female patient     Diagnosis Bipolar,  complicated grief, depression Symptoms unspecified Medication Status compliance  Safety none  If Suicidal or Homicidal State Action Taken: unspecified  Current Risk: low Medications See chart Objectives unspecified Client Response full compliance  Service Location Location, [address] [place], [place] [address]  Service Code cpt [phone]  Facilitate problem solving  Normalize/Reframe  Relaxation training  Self-monitoring  Self care activities  Emotion regulation skills  Validate/empathize  Behavioral activation plan  Session notes:  Dx. Bipolar 1,  and Complicated Grief  Meds:Cymbalta (30mg  then increase to 90mg ), Clonazepam 1mg , Wellcall 625mg  (3, twice daily with food), dextroamphetamine 20mg  (1 in afternoon), Trulicity (1 injection/week), Zetia 10mg , Gabapentin 300mg  at bedtime, Metforman 500mg  twice daily, Toprol XL 50mg  in morning, Protonix  40mg  in evening, Actos 45 mg in morning, Rexulti 2 mg in morning, Trazedone 200mg , Vit. D2, Vivance 70mg  every morning, B12 and baby aspirin. Goals/Treatment Plan: Patient reports feeling depressed with symptoms of sadness, despair, hopelessness and helplessness. Struggles to manage daily affairs and live independently. Will utilize behavioral and insight oriented therapy to address symptoms. Needs support to organize herself and develop confidence to make decisions.  Goal Date is 12-25. She also needs help to grieve the loss of her husband. Date to achieve goal is 6-25.  Patient agrees to a video Public affairs consultant) session and understands the  limits of this platform. She is at home and provider in his home office.   [name] says she is still feeling depressed. Has started a weight loss program called [facility]. It is a food plan with a lost of supplements, including Wygovi .25mg . It has been 2 weeks and she says it is controlling her hunger. New meds include DHEA 5mg , Wygovi, estrogen patch, K2 D3, 200mg  Magnesium, Omega 3 fish oil 1000mg , Progesterone Micro 100mg , Thyroid support complex, Metoclopramide  10mg  2x/day. She has lost weight in these first few weeks. She was approved for disability and has less financial stress. We talked about her considering a volunteer activity to have more interactions with people. She is too isolated and it contributes to her depression.                                        Nancy Harris Time: 9:40a- 10:30a 50 min.

## 2024-04-05 ENCOUNTER — Other Ambulatory Visit: Payer: Self-pay | Admitting: Allergy and Immunology

## 2024-04-05 ENCOUNTER — Telehealth: Payer: Self-pay | Admitting: Allergy and Immunology

## 2024-04-05 DIAGNOSIS — K219 Gastro-esophageal reflux disease without esophagitis: Secondary | ICD-10-CM

## 2024-04-05 MED ORDER — PANTOPRAZOLE SODIUM 40 MG PO TBEC: DELAYED_RELEASE_TABLET | ORAL | 0 refills | Status: DC

## 2024-04-05 NOTE — Telephone Encounter (Signed)
 Patient is requesting a courtesy refill for Pantoprazole . She scheduled an OV for 10/15.

## 2024-04-05 NOTE — Telephone Encounter (Signed)
 Pt informed refill sent to [facility].

## 2024-04-13 ENCOUNTER — Encounter (INDEPENDENT_AMBULATORY_CARE_PROVIDER_SITE_OTHER): Payer: Self-pay

## 2024-04-13 ENCOUNTER — Ambulatory Visit: Admitting: Allergy and Immunology

## 2024-04-13 ENCOUNTER — Encounter: Payer: Self-pay | Admitting: Allergy and Immunology

## 2024-04-13 VITALS — BP 102/62 | HR 72 | Resp 14

## 2024-04-13 DIAGNOSIS — J4540 Moderate persistent asthma, uncomplicated: Secondary | ICD-10-CM

## 2024-04-13 DIAGNOSIS — J3089 Other allergic rhinitis: Secondary | ICD-10-CM

## 2024-04-13 DIAGNOSIS — J340 Abscess, furuncle and carbuncle of nose: Secondary | ICD-10-CM | POA: Diagnosis not present

## 2024-04-13 DIAGNOSIS — K219 Gastro-esophageal reflux disease without esophagitis: Secondary | ICD-10-CM | POA: Diagnosis not present

## 2024-04-13 NOTE — Patient Instructions (Addendum)
  1.  Continue Breztri  - 2 inhalations 1-2 times per day w/spacer   2.  Continue to Treat reflux / LPR with the following:  A. Pantoprazole  40 mg 2 times a day + famotidine  40 mg in PM B. Continue metoclopramide  as prescribed by GI team C. Replace throat clearing with swallowing/drinking maneuver  3. If needed:   A. Albuterol  + Fluticasone  44 - 2 inhalations TOGETHER every 4-6 hours    B. OTC antihistamines  4. Evaluation with ENT to examine recurrent left septal ulceration  5. Return to clinic 6 months or earlier if needed  6. Influenza = tamiflu, covid = Paxlovid

## 2024-04-13 NOTE — Progress Notes (Unsigned)
 [place] [facility] [place] [facility] [place] - [place] - [place]   Follow-up Note  Referring Provider: Trinidad Hun, MD Primary Provider: Trinidad Hun, MD Date of Office Visit: 04/13/2024  Subjective:   Nancy Harris (DOB: Oct 03, 1965) is a 58 y.o. female who [facility] in re-evaluation of the following:  HPI: [name] returns to this clinic in evaluation of asthma, reflux, LPR and nasal ulcer.  I last saw her in this clinic 17 June 2023.  Her asthma has been under excellent control and she rarely uses the short acting bronchodilator and she is using her Breztri  mostly 1 time per day.  She has not required a systemic steroid to treat an exacerbation.  She has been having issues with her recurrent left-sided nasal ulceration.  It will bleed and then get irritated and she will place some Bactroban  for about a week and then it heals up only to return every other month or every third month.  Her reflux is under very good control at this point since she started metoclopramide  prescribed by the GI team for her diabetic gastroparesis which was contributing to her reflux.  She continues on a proton pump inhibitor and H2 receptor blocker which appears to be working quite well and she has had very little problems with her throat.  Allergies as of 04/13/2024       Reactions   Latex Itching        Medication List    Airsupra  90-80 MCG/ACT Aero Generic drug: Albuterol -Budesonide Inhale 2 puffs into the lungs as needed (every four to six hours for cough, wheeze, shortness of breath.  Rinse, gargle, and spit after use).   aspirin EC 81 MG tablet Take 81 mg by mouth at bedtime.   brexpiprazole 2 MG Tabs tablet Commonly known as: REXULTI Take 2 mg by mouth daily.   Breztri  Aerosphere 160-9-4.8 MCG/ACT Aero inhaler Generic drug: budesonide-glycopyrrolate-formoterol USE 2 INHALATIONS WITH SPACER TWICE A DAY TO PREVENT COUGH OR WHEEZE. RINSE,  GARGLE, AND SPIT AFTER USE   CALCIUM-VITAMIN D PO Take by mouth daily.   clonazePAM 1 MG tablet Commonly known as: KLONOPIN Take 1 mg by mouth daily after breakfast.   colesevelam 625 MG tablet Commonly known as: WELCHOL Take 1,875 mg by mouth 2 (two) times daily with a meal.   DEXTROAMPHETAMINE SULFATE PO Take 20 mg by mouth daily.   Zenzedi 10 MG tablet Generic drug: dextroamphetamine 1 tablet in the morning Orally Once a day   DULoxetine 30 MG capsule Commonly known as: CYMBALTA Take 90 mg by mouth daily.   estradiol 0.0375 MG/24HR Commonly known as: VIVELLE-DOT Place 1 patch onto the skin 2 (two) times a week.   estradiol 0.1 MG/GM vaginal cream Commonly known as: ESTRACE Place 1 g vaginally 2 (two) times a week.   ezetimibe 10 MG tablet Commonly known as: ZETIA   famotidine  40 MG tablet Commonly known as: PEPCID  TAKE 1 TABLET AT BEDTIME   fexofenadine  180 MG tablet Commonly known as: ALLEGRA  TAKE 1 TABLET DAILY IF NEEDED  (THIS PRODUCT IS GENERIC FOR ALLEGRA )   fluticasone  44 MCG/ACT inhaler Commonly known as: FLOVENT  HFA Can use two puffs along with Albuterol  every six hours as needed for cough, wheeze, shortness of breath.  Rinse, gargle, and spit after use.   Iron 325 (65 Fe) MG Tabs Take 1 tablet by mouth at bedtime.   levothyroxine 137 MCG tablet Commonly known as: SYNTHROID 1 tablet in the  morning on an empty stomach Orally Once a day   metFORMIN 500 MG 24 hr tablet Commonly known as: GLUCOPHAGE-XR Take 500 mg by mouth 2 (two) times daily.   metoCLOPramide  10 MG tablet Commonly known as: REGLAN  Take 1 tablet (10 mg total) by mouth 2 (two) times daily.   metoprolol succinate 50 MG 24 hr tablet Commonly known as: TOPROL-XL Take 50 mg by mouth daily after breakfast. Take with or immediately following a meal.   pantoprazole  40 MG tablet Commonly known as: PROTONIX  TAKE 1 TABLET BY MOUTH TWICE DAILY AS DIRECTED   pioglitazone 45 MG  tablet Commonly known as: ACTOS Take 45 mg by mouth every morning.   progesterone 100 MG capsule Commonly known as: PROMETRIUM Take 100-200 mg by mouth at bedtime.   progesterone 100 MG capsule Commonly known as: PROMETRIUM   rOPINIRole 2 MG tablet Commonly known as: REQUIP Take 2 mg by mouth at bedtime.   traZODone 100 MG tablet Commonly known as: DESYREL Take 150 mg by mouth at bedtime as needed.   VITAMIN B-12 PO Take 1,000 mcg by mouth every morning.   Vitamin D (Ergocalciferol) 1.25 MG (50000 UNIT) Caps capsule Commonly known as: DRISDOL Take by mouth.   Vyvanse 50 MG capsule Generic drug: lisdexamfetamine Take 50 mg by mouth every morning.    Past Medical History:  Diagnosis Date   ADD (attention deficit disorder)    Allergy     Anemia    past hx    Arthritis    Brown recluse spider bite 02/2022   required IV antibiotics   Chronic insomnia 09/03/2020   Chronic kidney disease    Chronic tension-type headache, not intractable 08/15/2015   Diabetes (HCC)    Diabetes mellitus without complication    Essential tremor 11/30/2012   Excessive daytime sleepiness 09/03/2020   FH: kidney cancer    Generalized anxiety disorder    GERD (gastroesophageal reflux disease)    High cholesterol    History of skin cancer    Hyperlipidemia    Hypertension    Insomnia    Major depressive disorder    Neuromuscular disorder    HH, CTS, neuropathy feet and hands    Obstructive sleep apnea 11/06/2020   Osteoporosis    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 Otherwiser normal EGD   NASAL SEPTUM SURGERY     07/2008 or 07/2010- pt unsure    nephrectomy Right  05/30/2022   TOTAL ABDOMINAL HYSTERECTOMY  10/2004   UPPER GASTROINTESTINAL ENDOSCOPY      Review of systems negative except as noted in HPI / PMHx or noted below:  Review of Systems  Constitutional: Negative.   HENT: Negative.    Eyes: Negative.   Respiratory: Negative.    Cardiovascular: Negative.   Gastrointestinal: Negative.   Genitourinary: Negative.   Musculoskeletal: Negative.   Skin: Negative.   Neurological: Negative.   Endo/Heme/Allergies: Negative.   Psychiatric/Behavioral: Negative.       Objective:   Vitals:   04/13/24 1140  BP: 102/62  Pulse: 72  Resp: 14  SpO2: 95%          Physical Exam Constitutional:  Appearance: She is not diaphoretic.  HENT:     Head: Normocephalic.     Right Ear: Tympanic membrane, ear canal and external ear normal.     Left Ear: Tympanic membrane, ear canal and external ear normal.     Nose: Nose normal. No mucosal edema (no left septal ulcer, White flat non-erythematous lacy lesion left nasal septum) or rhinorrhea.     Mouth/Throat:     Pharynx: Uvula midline. No oropharyngeal exudate.  Eyes:     Conjunctiva/sclera: Conjunctivae normal.  Neck:     Thyroid: No thyromegaly.     Trachea: Trachea normal. No tracheal tenderness or tracheal deviation.  Cardiovascular:     Rate and Rhythm: Normal rate and regular rhythm.     Heart sounds: Normal heart sounds, S1 normal and S2 normal. No murmur heard. Pulmonary:     Effort: No respiratory distress.     Breath sounds: Normal breath sounds. No stridor. No wheezing or rales.  Lymphadenopathy:     Head:     Right side of head: No tonsillar adenopathy.     Left side of head: No tonsillar adenopathy.     Cervical: No cervical adenopathy.  Skin:    Findings: No erythema or rash.     Nails: There is no clubbing.  Neurological:     Mental Status: She is alert.     Diagnostics: Spirometry was performed and demonstrated an FEV1 of 1.87 at 84 % of predicted.  Assessment  and Plan:   1. Asthma, moderate persistent, well-controlled   2. Perennial allergic rhinitis   3. Nasal septum ulceration   4. LPRD (laryngopharyngeal reflux disease)     1.  Continue Breztri  - 2 inhalations 1-2 times per day w/spacer   2.  Continue to Treat reflux / LPR with the following:  A. Pantoprazole  40 mg 2 times a day + famotidine  40 mg in PM B. Continue metoclopramide  as prescribed by GI team C. Replace throat clearing with swallowing/drinking maneuver  3. If needed:   A. Albuterol  + Fluticasone  44 - 2 inhalations TOGETHER every 4-6 hours    B. OTC antihistamines  4. Evaluation with ENT to examine recurrent left septal ulceration  5. Return to clinic 6 months or earlier if needed  6. Influenza = tamiflu, covid = Paxlovid  [name] appears to be doing quite well regarding her respiratory tract issue involving inflammation of her lower airway and her LPR.  There is a nasal mucosal abnormality that appears to flareup with bleeding and inflammation and I am going to have ENT take a look at that issue to see if there needs to be any further evaluation or treatment for that abnormality.  I will see her back in this clinic possibly 6 months or earlier if there is a problem.  Camellia Denis, MD Allergy  / Immunology [facility]

## 2024-04-14 ENCOUNTER — Encounter: Payer: Self-pay | Admitting: Allergy and Immunology

## 2024-04-20 ENCOUNTER — Telehealth: Payer: Self-pay | Admitting: *Deleted

## 2024-04-20 NOTE — Telephone Encounter (Signed)
 Faxed completed/signed form to [employer], received fax confirmation.

## 2024-04-24 ENCOUNTER — Other Ambulatory Visit: Payer: Self-pay | Admitting: Allergy and Immunology

## 2024-04-25 ENCOUNTER — Ambulatory Visit: Admitting: Psychology

## 2024-04-25 DIAGNOSIS — F4381 Prolonged grief disorder: Secondary | ICD-10-CM | POA: Diagnosis not present

## 2024-04-25 DIAGNOSIS — F319 Bipolar disorder, unspecified: Secondary | ICD-10-CM

## 2024-04-25 DIAGNOSIS — F331 Major depressive disorder, recurrent, moderate: Secondary | ICD-10-CM

## 2024-04-25 NOTE — Progress Notes (Signed)
 Nancy Harris is a 58 y.o. female patient     Diagnosis Bipolar,  complicated grief, depression Symptoms unspecified Medication Status compliance  Safety none  If Suicidal or Homicidal State Action Taken: unspecified  Current Risk: low Medications See chart Objectives unspecified Client Response full compliance  Service Location Location, [address] [place], [place] [address]  Service Code cpt [phone]  Facilitate problem solving  Normalize/Reframe  Relaxation training  Self-monitoring  Self care activities  Emotion regulation skills  Validate/empathize  Behavioral activation plan  Session notes:  Dx. Bipolar 1,  and Complicated Grief  Meds:Cymbalta (30mg  then increase to 90mg ), Clonazepam 1mg , Wellcall 625mg  (3, twice daily with food), dextroamphetamine 20mg  (1 in afternoon), Trulicity (1 injection/week), Zetia 10mg , Gabapentin 300mg  at bedtime, Metforman 500mg  twice daily, Toprol XL 50mg  in morning, Protonix  40mg  in evening, Actos 45 mg in morning, Rexulti 2 mg in morning, Trazedone 200mg , Vit. D2, Vivance 70mg  every morning, B12 and baby aspirin. Goals/Treatment Plan: Patient reports feeling depressed with symptoms of sadness, despair, hopelessness and helplessness. Struggles to manage daily affairs and live independently. Will utilize behavioral and insight oriented therapy to address symptoms. Needs support to organize herself and develop confidence to make decisions.  Goal Date is 12-25. She also needs help to grieve the loss of her husband. Date to achieve goal is 6-25.  Patient agrees to a video  Public Affairs Consultant) session and understands the limits of this platform. She is at home and provider in his home office.   [name] says she has lost 15 lbs. in her diet even though she had to stop the Wygovy. She is feeling good and hasn't been too hungry. She had a birthday last week and went out for several celebrations with family. She reports that she is still feeling bored and needs to start doing something to occupy her time. With nothing to do, she finds herself just wanting to sleep. She is looking to get into her own apartment. Hopes to be able to move soon and is confident she will be able to afford having her own place. Talked about her fears of being alone and how to address.                                           Shandria Clinch LEWIS Time: 10:40a- 11:30a 50 min.

## 2024-05-10 ENCOUNTER — Encounter: Payer: Self-pay | Admitting: Neurology

## 2024-05-10 ENCOUNTER — Ambulatory Visit: Admitting: Neurology

## 2024-05-10 VITALS — BP 92/59 | HR 70 | Ht 60.0 in | Wt 170.5 lb

## 2024-05-10 DIAGNOSIS — G4733 Obstructive sleep apnea (adult) (pediatric): Secondary | ICD-10-CM

## 2024-05-10 NOTE — Progress Notes (Signed)
 PATIENT: Nancy Harris DOB: [birth year]  REASON FOR VISIT: Follow up HISTORY FROM: Patient PRIMARY NEUROLOGIST: Dr. Chalice   ASSESSMENT AND PLAN  1.  Bipolar disorder, depression   2.  Sleep apnea (setup date July 2022)   3.  Memory disturbance   4.  Chronic insomnia  - Recommend nightly CPAP use minimum 4 hours - Continue current settings, AHI is well treated - Continue to replace supplies routinely with DME -She is on multiple stimulants and mood managing medications -PSG in March 2022 showed moderate severe OSA with AHI 23.8/hour -CPAP setup July 2022 -Neuropsychological testing was largely within normal limits, cognitive dysfunction was felt most likely related to psychiatric distress -Dr. Chalice felt her persistent hypersomnia was not apnea related -Follow-up in 1 year for CPAP  HISTORY OF PRESENT ILLNESS: Today 05/10/24 05/10/24 SS: CPAP report shows 60% compliance. 6-14 cm water. Leak 32.6, AHI 2.6. sometimes she forgets to put CPAP on. With CPAP, feels better, sleep is more restorative, more energy the next day. Using nasal mask, it does leak, hard to get just right. Got approved for disability. Still depressed.   05/05/24 SS: CPAP use is still sub optimal. Falls asleep without putting it on. With CPAP feels more rested. Using nasal pillow mask. Stress having to move out of her home due to finances, moving in with her daughter. Is not working, trying to get back at [employer]. Has had some issues with sciatic nerve, did well with PT. She is driving. Most nights sleeps well.  Sometimes she will wake up coughing with CPAP.  Does not feel like it is too much pressure.  Of last 90 days 40% compliance, AHI 30, leak 22.  ESS 15.   10/30/22 SS: CPAP data from 08/02/22-10/30/22 29/90 days 32% > 4 hours 17 days 19%. 6-12 cm water, EPR 2. AHI 3.0. Leak 14.2. CPAP use is poor, is not sleeping well. She takes it off if she can't fall asleep. Using nasal pillow mask. Has been  diagnosed with depression. Sees therapist. Takes trazodone for sleep. March 2022, sleep study showed moderate severe OSA 23.8/hour. ESS 17. Never started her work at [employer]. Interview next week to work at [employer].  Also on Adderall, Rexulti, Klonopin, Cymbalta, Ritalin, Requip, trazodone.  04/22/22 SS: Nancy Harris is here today for follow-up. Mentions recently had spider bite, admitted for 6 days for antibiotics at [facility]. 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 Has follow up about this next week. 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 Doing virtual visit with counselor. Since off Effexor, on Cymbalta, feels better. More upbeat. Still has daytime sleepiness. Remains on Vyvanse, Adderall, from psychiatry. Stopped her CPAP for 2 months, got lazy, didn't want to clean it. Felt more tired.  She has a card download, data from 03/23/22-04/21/22 shows suboptimal usage 21/30 days at 70%, greater than 4 hours 15 days at 50%.  Average usage days used 5 hours 42 minutes.  Minimum pressure 6, maximum pressure 14 cm water.  Leak in the 95th percentile 19.3.  AHI 1.7.  Uses full facemask. ESS 18.   Update 06/27/2021 SS: Nancy Harris Nancy Harris care today for follow-up with history of bipolar disorder, chronic insomnia, memory disturbance, and sleep apnea on CPAP.  Saw Dr. 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  Had neuropsychological testing, was largely within normal limits.  Her responses suggest she is depressed, discouraged,  and withdrawal.  Cognitive dysfunction most likely related to ongoing psychiatric distress.  Not suggestive of any form of neurodegenerative illness. Had cognitive training via speech therapy. Seeing psychiatry, doesn't feel like much as changed in regards to her anxiety, depression, grief. They are adjusting her medications. Living alone. Just started a new job, scientist, physiological at [employer], part-time. Going to try harder  with time management not to be late. Using CPAP, doing well, uses most all night, no issues. Knows sleeping better, not sure she feels much different. Headaches are much less, 1 every few weeks, will take Advil, but usually doesn't need anything. Has been taken off Topamax, on less gabapentin and Klonopin. Seeing Dr. Gutterman with psychology at [facility], doing VV every few weeks. CPAP has a card.   HISTORY  11/06/2020 Dr. Jenel: Nancy Harris is a 58 year old right-handed white female with history of bipolar disorder.  The patient is followed through [facility] for her depression.  The patient has been noted to have significant fatigue during the day.  She was set up for sleep evaluation at showed evidence of sleep apnea, she has not yet been placed on CPAP.  The patient has chronic insomnia, she takes melatonin around 8 PM but does not really get sleep until 1 or 2 AM.  On weekends, she may sleep until afternoon.  She has to go to work at around 11 AM, but she oftentimes shows up late.  She is now getting cognitive training through speech therapy, this is helping some.  She has had a lifelong problem with time management.  She was taken off of her Topamax which seems to have helped some with her headaches but she still has frequent early morning headaches that she wakes up with.  The patient reports a 20 pound weight gain in the last year.  She still has some ongoing chronic fatigue throughout the day but she takes the clonazepam 1 mg in the morning.  She is off of her sleeping pills at night, she has reduced the gabapentin to 300 mg twice daily.  She has had some increase in anxiety on the lower dose of gabapentin.  The patient returns to the office today for an evaluation.  REVIEW OF SYSTEMS: Out of a complete 14 system review of symptoms, the patient complains only of the following symptoms, and all other reviewed systems are negative.  See HPI  ALLERGIES: Allergies  Allergen Reactions    Latex Itching    HOME MEDICATIONS: Outpatient Medications Prior to Visit  Medication Sig Dispense Refill   aspirin EC 81 MG tablet Take 81 mg by mouth at bedtime.     brexpiprazole (REXULTI) 2 MG TABS tablet Take 2 mg by mouth daily.     budesonide-glycopyrrolate-formoterol (BREZTRI  AEROSPHERE) 160-9-4.8 MCG/ACT AERO inhaler USE 2 INHALATIONS WITH SPACER TWICE A DAY TO PREVENT COUGH OR WHEEZE. RINSE, GARGLE, AND SPIT AFTER USE 32.1 g 3   CALCIUM-VITAMIN D PO Take by mouth daily.     clonazePAM (KLONOPIN) 1 MG tablet Take 1 mg by mouth daily after breakfast.     colesevelam (WELCHOL) 625 MG tablet Take 1,875 mg by mouth 2 (two) times daily with a meal.     Cyanocobalamin (VITAMIN B-12 PO) Take 1,000 mcg by mouth every morning.     DEXTROAMPHETAMINE SULFATE PO Take 20 mg by mouth daily.     DULoxetine (CYMBALTA) 30 MG capsule Take 90 mg by mouth daily.     estradiol (ESTRACE) 0.1 MG/GM  vaginal cream Place 1 g vaginally 2 (two) times a week.     estradiol (VIVELLE-DOT) 0.0375 MG/24HR Place 1 patch onto the skin 2 (two) times a week.     ezetimibe (ZETIA) 10 MG tablet      famotidine  (PEPCID ) 40 MG tablet TAKE 1 TABLET AT BEDTIME 90 tablet 3   Ferrous Sulfate (IRON) 325 (65 Fe) MG TABS Take 1 tablet by mouth at bedtime.     fexofenadine  (ALLEGRA ) 180 MG tablet TAKE 1 TABLET DAILY IF NEEDED  (THIS PRODUCT IS GENERIC FOR ALLEGRA ) 90 tablet 3   fluticasone  (FLOVENT  HFA) 44 MCG/ACT inhaler Can use two puffs along with Albuterol  every six hours as needed for cough, wheeze, shortness of breath.  Rinse, gargle, and spit after use. 10.6 g 1   levothyroxine (SYNTHROID) 137 MCG tablet 1 tablet in the morning on an empty stomach Orally Once a day (Patient taking differently: Take 125 mcg by mouth daily before breakfast.)     metFORMIN (GLUCOPHAGE-XR) 500 MG 24 hr tablet Take 500 mg by mouth 2 (two) times daily.     metoCLOPramide  (REGLAN ) 10 MG tablet Take 1 tablet (10 mg total) by mouth 2 (two) times  daily. 60 tablet 1   metoprolol succinate (TOPROL-XL) 50 MG 24 hr tablet Take 50 mg by mouth daily after breakfast. Take with or immediately following a meal.     pantoprazole  (PROTONIX ) 40 MG tablet TAKE 1 TABLET BY MOUTH TWICE DAILY AS DIRECTED 180 tablet 6   pioglitazone (ACTOS) 45 MG tablet Take 45 mg by mouth every morning.     progesterone (PROMETRIUM) 100 MG capsule Take 100-200 mg by mouth at bedtime.     progesterone (PROMETRIUM) 100 MG capsule      rOPINIRole (REQUIP) 2 MG tablet Take 2 mg by mouth at bedtime.     traZODone (DESYREL) 100 MG tablet Take 150 mg by mouth at bedtime as needed.     Vitamin D, Ergocalciferol, (DRISDOL) 1.25 MG (50000 UNIT) CAPS capsule Take by mouth.     VYVANSE 50 MG capsule Take 50 mg by mouth every morning.     ZENZEDI 10 MG tablet 1 tablet in the morning Orally Once a day     AIRSUPRA  90-80 MCG/ACT AERO Inhale 2 puffs into the lungs as needed (every four to six hours for cough, wheeze, shortness of breath.  Rinse, gargle, and spit after use). 10.7 g 1   albuterol  (VENTOLIN  HFA) 108 (90 Base) MCG/ACT inhaler 1 puff as needed Inhalation every 4 hrs     Multiple Minerals-Vitamins (CALCIUM & VIT D3 BONE HEALTH) LIQD as directed Orally     No facility-administered medications prior to visit.    PAST MEDICAL HISTORY: Past Medical History:  Diagnosis Date   ADD (attention deficit disorder)    Allergy     Anemia    past hx    Arthritis    Brown recluse spider bite 02/2022   required IV antibiotics   Chronic insomnia 09/03/2020   Chronic kidney disease    Chronic tension-type headache, not intractable 08/15/2015   Diabetes (HCC)    Diabetes mellitus without complication    Essential tremor 11/30/2012   Excessive daytime sleepiness 09/03/2020   FH: kidney cancer    Generalized anxiety disorder    GERD (gastroesophageal reflux disease)    High cholesterol    History of skin cancer    Hyperlipidemia    Hypertension    Insomnia    Major  depressive disorder  Neuromuscular disorder    HH, CTS, neuropathy feet and hands    Obstructive sleep apnea 11/06/2020   Osteoporosis    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 Otherwiser normal EGD   NASAL SEPTUM SURGERY     07/2008 or 07/2010- pt unsure    nephrectomy Right 05/30/2022   TOTAL ABDOMINAL HYSTERECTOMY  10/2004   UPPER GASTROINTESTINAL ENDOSCOPY      FAMILY HISTORY: Family History  Problem Relation Age of Onset   Lung cancer Mother        Smoker   Alzheimer's disease Mother        early-onset; approximately 28   Diabetes Father    Lung cancer Father        Smoker   Diabetes Sister    Allergic rhinitis Sister    Diabetes Sister    Asthma Sister    Alzheimer's disease Maternal Grandfather    Colon cancer Paternal Grandmother    Esophageal varices Neg Hx    Liver disease Neg Hx     SOCIAL HISTORY: Social History   Socioeconomic History   Marital status: Widowed    Spouse name: Not on file   Number of children: 1   Years of education: 12   Highest education level: High school graduate  Occupational History   Occupation: Conservation Officer, [employer]    Comment: Part time   Occupation: unemployed  Tobacco Use   Smoking status: Never   Smokeless tobacco: Never  Vaping Use   Vaping status: Never Used  Substance and Sexual Activity   Alcohol use: Not Currently   Drug use: Never   Sexual activity: Not on file  Other Topics Concern   Not on file  Social History Narrative   Lives alone   Right Handed   Drinks caffeine occassionally   Social Drivers of Health   Financial Resource Strain: Not on file  Food Insecurity: Not on file   Transportation Needs: Not on file  Physical Activity: Not on file  Stress: Not on file  Social Connections: Not on file  Intimate Partner Violence: Not on file   PHYSICAL EXAM  Vitals:   05/10/24 1334  BP: (!) 92/59  Pulse: 70  SpO2: 98%  Weight: 170 lb 8 oz (77.3 kg)  Height: 5' (1.524 m)    Body mass index is 33.3 kg/m.  Generalized: Well developed, in no acute distress, well-dressed, appearing.  Looks tired.  Flat. Neurological examination  Mentation: Alert oriented to time, place, history taking. Follows all commands speech and language fluent.  Cranial nerve II-XII: Pupils were equal round reactive to light.  Motor: Moves all extremities independent Gait and station: Gait is normal.   DIAGNOSTIC DATA (LABS, IMAGING, TESTING) - I reviewed patient records, labs, notes, testing and imaging myself where available.  No results found for: WBC, HGB, HCT, MCV, PLT No results found for: NA, K, CL, CO2, GLUCOSE, BUN, CREATININE, CALCIUM, PROT, ALBUMIN, AST, ALT, ALKPHOS, BILITOT, GFRNONAA, GFRAA No results found for: CHOL, HDL, LDLCALC, LDLDIRECT, TRIG, CHOLHDL No results found for: YHAJ8R Lab Results  Component Value Date   VITAMINB12 1,060 07/04/2020   Lab Results  Component  Value Date   TSH 0.398 (L) 07/04/2020   Lauraine Born, AGNP-C, DNP 05/10/2024, 1:56 PM [facility] [address] [place], [place] [address] [phone]

## 2024-05-10 NOTE — Patient Instructions (Signed)
 Great to see you today! Recommend CPAP use nightly minimum 4 hours.  We will continue current settings.  Continue to replace supplies routinely through DME.  Follow-up in 1 year.  Thanks!!

## 2024-05-11 ENCOUNTER — Institutional Professional Consult (permissible substitution) (INDEPENDENT_AMBULATORY_CARE_PROVIDER_SITE_OTHER)

## 2024-06-02 ENCOUNTER — Institutional Professional Consult (permissible substitution) (INDEPENDENT_AMBULATORY_CARE_PROVIDER_SITE_OTHER)

## 2024-06-13 ENCOUNTER — Ambulatory Visit: Admitting: Psychology

## 2024-06-13 DIAGNOSIS — F331 Major depressive disorder, recurrent, moderate: Secondary | ICD-10-CM | POA: Diagnosis not present
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
## 2024-06-15 ENCOUNTER — Ambulatory Visit (INDEPENDENT_AMBULATORY_CARE_PROVIDER_SITE_OTHER)

## 2024-06-15 ENCOUNTER — Encounter (INDEPENDENT_AMBULATORY_CARE_PROVIDER_SITE_OTHER): Payer: Self-pay

## 2024-06-15 VITALS — BP 101/66 | HR 77 | Ht 60.0 in | Wt 170.0 lb

## 2024-06-15 DIAGNOSIS — J340 Abscess, furuncle and carbuncle of nose: Secondary | ICD-10-CM

## 2024-06-15 DIAGNOSIS — Z9889 Other specified postprocedural states: Secondary | ICD-10-CM | POA: Diagnosis not present

## 2024-06-15 DIAGNOSIS — J3489 Other specified disorders of nose and nasal sinuses: Secondary | ICD-10-CM | POA: Diagnosis not present

## 2024-06-15 MED ORDER — MUPIROCIN 2 % EX OINT: 1.0000 | TOPICAL_OINTMENT | Freq: Two times a day (BID) | CUTANEOUS | 0 refills | Status: AC

## 2024-06-15 MED ORDER — AYR SALINE NASAL NA GEL: 1.0000 | Freq: Every evening | NASAL | 0 refills | Status: AC

## 2024-06-15 NOTE — Progress Notes (Signed)
 Dear Dr. Maurilio, Here is my assessment for our mutual patient, Nancy Harris. Thank you for allowing me the opportunity to care for your patient. Please do not hesitate to contact me should you have any other questions. Sincerely, Dr. Hadassah Parody  Otolaryngology Clinic Note Referring provider: Dr. Maurilio HPI:   Initial HPI (06/15/2024) Discussed the use of AI scribe software for clinical note transcription with the patient, who gave verbal consent to proceed.  History of Present Illness Nancy Harris is a 58 year old female who presents with a recurrent ulcer on her nose.  Recurrent nasal ulceration Nasal dryness Epistaxis  - Ulcer located inside the nose on the left, present for over one year - Episodes occur every two to three months, with healing between episodes with bactroban  use but uses inconsistently  - Ulcer becomes raw and bleeds, especially when the area is dry - Habit of picking at the area, which exacerbates the condition - Nose tends to be dry, associated with recurrence of the ulcer - Occasional use of Vaseline for moisture - Avoids nasal sprays due to previous episodes of epistaxis - Has not used nasal saline gels or similar products to maintain moisture  Cpap use and nasal symptoms - Uses CPAP machine at night, believed to contribute to nasal dryness  History of nasal surgery - History of deviated septum, surgically corrected around 2010 or 2012 - Similar symptoms resolved following septal surgery   Independent Review of Additional Tests or Records:  Referral note Nancy Maurilio, MD 04/13/24: well controlled asthma, recurrent left sided nasal ulceration. Returns after using bactroban     PMH/Meds/All/SocHx/FamHx/ROS:   Past Medical History:  Diagnosis Date   ADD (attention deficit disorder)    Allergy     Anemia    past hx    Arthritis    Brown recluse spider bite 02/2022   required IV antibiotics   Chronic insomnia 09/03/2020    Chronic kidney disease    Chronic tension-type headache, not intractable 08/15/2015   Diabetes (HCC)    Diabetes mellitus without complication    Essential tremor 11/30/2012   Excessive daytime sleepiness 09/03/2020   FH: kidney cancer    Generalized anxiety disorder    GERD (gastroesophageal reflux disease)    High cholesterol    History of skin cancer    Hyperlipidemia    Hypertension    Insomnia    Major depressive disorder    Neuromuscular disorder    HH, CTS, neuropathy feet and hands    Obstructive sleep apnea 11/06/2020   Osteoporosis    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 Otherwiser normal EGD   NASAL SEPTUM SURGERY     07/2008 or 07/2010- pt unsure    nephrectomy Right 05/30/2022   TOTAL ABDOMINAL HYSTERECTOMY  10/2004   UPPER GASTROINTESTINAL ENDOSCOPY      Family History  Problem Relation Age of Onset   Lung cancer Mother        Smoker   Alzheimer's disease Mother        early-onset; approximately 61   Diabetes Father    Lung cancer Father  Smoker   Diabetes Sister    Allergic rhinitis Sister    Diabetes Sister    Asthma Sister    Alzheimer's disease Maternal Grandfather    Colon cancer Paternal Grandmother    Esophageal varices Neg Hx    Liver disease Neg Hx      Social Connections: Not on file     Current Outpatient Medications  Medication Instructions   AIRSUPRA  90-80 MCG/ACT AERO 2 puffs, Inhalation, As needed   albuterol  (VENTOLIN  HFA) 108 (90 Base) MCG/ACT inhaler 1 puff as needed Inhalation every 4 hrs   aspirin EC 81 mg, Daily at bedtime   brexpiprazole (REXULTI) 2 mg, Daily   budesonide-glycopyrrolate-formoterol  (BREZTRI  AEROSPHERE) 160-9-4.8 MCG/ACT AERO inhaler USE 2 INHALATIONS WITH SPACER TWICE A DAY TO PREVENT COUGH OR WHEEZE. RINSE, GARGLE, AND SPIT AFTER USE   CALCIUM-VITAMIN D PO Daily   clonazePAM (KLONOPIN) 1 mg, Daily after breakfast   colesevelam (WELCHOL) 1,875 mg, 2 times daily with meals   Cyanocobalamin (VITAMIN B-12 PO) 1,000 mcg, BH-each morning   DEXTROAMPHETAMINE SULFATE PO 20 mg, Daily   DULoxetine (CYMBALTA) 90 mg, Daily   estradiol (ESTRACE) 1 g, 2 times weekly   estradiol (VIVELLE-DOT) 0.0375 MG/24HR 1 patch, 2 times weekly   ezetimibe (ZETIA) 10 MG tablet No dose, route, or frequency recorded.   famotidine  (PEPCID ) 40 mg, Oral, Daily at bedtime   Ferrous Sulfate (IRON) 325 (65 Fe) MG TABS 1 tablet, Daily at bedtime   fexofenadine  (ALLEGRA ) 180 MG tablet TAKE 1 TABLET DAILY IF NEEDED  (THIS PRODUCT IS GENERIC FOR ALLEGRA )   fluticasone  (FLOVENT  HFA) 44 MCG/ACT inhaler Can use two puffs along with Albuterol  every six hours as needed for cough, wheeze, shortness of breath.  Rinse, gargle, and spit after use.   levothyroxine (SYNTHROID) 137 MCG tablet 1 tablet in the morning on an empty stomach Orally Once a day   metFORMIN (GLUCOPHAGE-XR) 500 mg, 2 times daily   metoCLOPramide  (REGLAN ) 10 mg, Oral, 2 times daily   metoprolol succinate (TOPROL-XL) 50 mg, Daily after breakfast   Multiple Minerals-Vitamins (CALCIUM & VIT D3 BONE HEALTH) LIQD as directed Orally   mupirocin  ointment (BACTROBAN ) 2 % 1 Application, Topical, 2 times daily   pantoprazole  (PROTONIX ) 40 mg, Oral, 2 times daily, as directed   pioglitazone (ACTOS) 45 mg, BH-each morning   progesterone (PROMETRIUM) 100 MG capsule    progesterone (PROMETRIUM) 100-200 mg, Daily at bedtime   rOPINIRole (REQUIP) 2 mg, Daily at bedtime   saline (AYR) GEL 1 Application, Each Nare, Nightly   traZODone (DESYREL) 150 mg, At bedtime PRN   Vitamin D, Ergocalciferol, (DRISDOL) 1.25 MG (50000 UNIT) CAPS capsule Take by mouth.    Vyvanse 50 mg, Every morning   ZENZEDI 10 MG tablet 1 tablet in the morning Orally Once a day     Physical Exam:   BP 101/66 (BP Location: Left Arm, Patient Position: Sitting)   Pulse 77   Ht 5' (1.524 m)   Wt 170 lb (77.1 kg)   SpO2 95%   BMI 33.20 kg/m   Salient findings:  CN II-XII intact  Bilateral EAC clear and TM intact with well pneumatized middle ear spaces  Anterior rhinoscopy: Septum midline anteriorly; bilateral inferior turbinates with mild hypertrophy Nasal endoscopy was indicated to better evaluate the nose and paranasal sinuses, given the patient's history and exam findings, and is detailed below.   No lesions of oral cavity/oropharynx No obviously palpable neck masses/lymphadenopathy/thyromegaly No respiratory distress or stridor  Seprately Identifiable Procedures:  Prior to initiating any procedures, risks/benefits/alternatives were explained to the patient and verbal consent obtained.  PROCEDURE (06/15/2024): Bilateral Diagnostic Rigid Nasal Endoscopy Pre-procedure diagnosis: Concern for nasal septal ulceration Post-procedure diagnosis: same Indication: See pre-procedure diagnosis and physical exam above Complications: None apparent EBL: 0 mL Anesthesia: Lidocaine 4% and topical decongestant was topically sprayed in each nasal cavity  Description of Procedure:  Patient was identified. A rigid 30 degree endoscope was utilized to evaluate the sinonasal cavities, mucosa, sinus ostia and turbinates and septum.  Overall, signs of mucosal inflammation are noted.  Also noted are dry nasal mucosa on septum, no evidence of ulceration at area of concern at this time .  No mucopurulence, polyps, or masses noted.   Photodocumentation was obtained.  CPT CODE -- 68768 - Mod 25   Impression & Plans:  Altha Sweitzer is a 58 y.o. female with   1. Nasal septal ulcer   2. Nasal mucosa dry   3. H/O nasal septoplasty     Assessment and Plan Assessment &  Plan Recurrent nasal ulceration Nasal mucosal dryness History of septoplasty Recurrent nasal ulceration over the past year, located on the left side with episodes every two to three months. Currently healed.  Likely recurrent ulceration is due to nasal dryness which is exacerbated by CPAP use and irritation from picking. Healing suggests no underlying malignancy or autoimmune process. - Prescribed nasal saline gel for nightly use to maintain moisture, especially in winter months. - Continue mupirocin  ointment during episodes  - Advised against picking at the ulcerated area to prevent exacerbation. - She will call if symptoms worsen or fail to improve.    See below regarding exact medications prescribed this encounter including dosages and route: Meds ordered this encounter  Medications   saline (AYR) GEL    Sig: Place 1 Application into both nostrils at bedtime.    Dispense:  14.1 g    Refill:  0   mupirocin  ointment (BACTROBAN ) 2 %    Sig: Apply 1 Application topically 2 (two) times daily for 7 days.    Dispense:  14 g    Refill:  0      Thank you for allowing me the opportunity to care for your patient. Please do not hesitate to contact me should you have any other questions.  Sincerely, Hadassah Parody, MD Otolaryngologist (ENT), [facility] Phone: [phone] Fax: [phone]  MDM:  Level 3 Complexity/Problems addressed: 3-acute uncomplicated injury Data complexity: 2-independent review of referral note - Morbidity: 4-prescription medication management - Prescription Drug prescribed or managed: Yes

## 2024-07-11 ENCOUNTER — Ambulatory Visit: Admitting: Psychology

## 2024-07-11 DIAGNOSIS — F331 Major depressive disorder, recurrent, moderate: Secondary | ICD-10-CM
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
## 2024-08-03 ENCOUNTER — Telehealth (INDEPENDENT_AMBULATORY_CARE_PROVIDER_SITE_OTHER): Payer: Self-pay

## 2024-08-03 NOTE — Telephone Encounter (Signed)
 I spoke with patient after she left a message at 12:08 pm. She stated that she wants to talk to billing about in office procedure billing. I informed patient to Dr. Lennart did use a Rigid scope in office the day of her visit. Patient asked if that is a procedure, I told her yes. I gave her the # to billing.

## 2024-08-03 NOTE — Telephone Encounter (Signed)
 Patient was explained to that a Rigid scope was used in her visit that is what is being called the procedure. Patient stated that since you did not scrape, cut, or take anything out it should not be considered a procedure. I explained that's how it is always charged and she was given the number to billing to discuss it further.

## 2024-08-15 ENCOUNTER — Ambulatory Visit: Admitting: Psychology

## 2024-10-12 ENCOUNTER — Ambulatory Visit: Admitting: Allergy and Immunology

## 2025-05-16 ENCOUNTER — Ambulatory Visit: Admitting: Neurology
